# Patient Record
Sex: Female | Born: 1995 | Race: Asian | Hispanic: No | Smoking: Never smoker
Health system: Southern US, Community
[De-identification: ages and names within clinical notes are randomized; demographics above are authoritative.]

## PROBLEM LIST (undated history)

## (undated) DIAGNOSIS — Z862 Personal history of diseases of the blood and blood-forming organs and certain disorders involving the immune mechanism: Secondary | ICD-10-CM

## (undated) DIAGNOSIS — Z8751 Personal history of pre-term labor: Secondary | ICD-10-CM

## (undated) DIAGNOSIS — D649 Anemia, unspecified: Secondary | ICD-10-CM

## (undated) DIAGNOSIS — Z8679 Personal history of other diseases of the circulatory system: Secondary | ICD-10-CM

## (undated) HISTORY — DX: Anemia, unspecified: D64.9

## (undated) HISTORY — DX: Personal history of other diseases of the circulatory system: Z86.79

## (undated) HISTORY — DX: Personal history of diseases of the blood and blood-forming organs and certain disorders involving the immune mechanism: Z86.2

## (undated) HISTORY — PX: OTHER SURGICAL HISTORY: SHX169

## (undated) HISTORY — DX: Personal history of pre-term labor: Z87.51

---

## 2017-03-03 DIAGNOSIS — O4290 Premature rupture of membranes, unspecified as to length of time between rupture and onset of labor, unspecified weeks of gestation: Secondary | ICD-10-CM

## 2018-06-12 DIAGNOSIS — N883 Incompetence of cervix uteri: Secondary | ICD-10-CM

## 2020-06-14 ENCOUNTER — Other Ambulatory Visit: Payer: Self-pay

## 2020-06-14 ENCOUNTER — Encounter: Payer: Self-pay | Admitting: Advanced Practice Midwife

## 2020-06-14 ENCOUNTER — Ambulatory Visit: Payer: Medicaid Other | Admitting: Advanced Practice Midwife

## 2020-06-14 VITALS — BP 96/57 | HR 95 | Temp 97.6°F | Wt 94.0 lb

## 2020-06-14 DIAGNOSIS — O0993 Supervision of high risk pregnancy, unspecified, third trimester: Secondary | ICD-10-CM | POA: Diagnosis not present

## 2020-06-14 DIAGNOSIS — Z8751 Personal history of pre-term labor: Secondary | ICD-10-CM

## 2020-06-14 DIAGNOSIS — O99519 Diseases of the respiratory system complicating pregnancy, unspecified trimester: Secondary | ICD-10-CM

## 2020-06-14 DIAGNOSIS — O99019 Anemia complicating pregnancy, unspecified trimester: Secondary | ICD-10-CM | POA: Insufficient documentation

## 2020-06-14 DIAGNOSIS — O99012 Anemia complicating pregnancy, second trimester: Secondary | ICD-10-CM

## 2020-06-14 DIAGNOSIS — O0930 Supervision of pregnancy with insufficient antenatal care, unspecified trimester: Secondary | ICD-10-CM

## 2020-06-14 DIAGNOSIS — O0991 Supervision of high risk pregnancy, unspecified, first trimester: Secondary | ICD-10-CM

## 2020-06-14 DIAGNOSIS — R6252 Short stature (child): Secondary | ICD-10-CM

## 2020-06-14 DIAGNOSIS — J45909 Unspecified asthma, uncomplicated: Secondary | ICD-10-CM | POA: Insufficient documentation

## 2020-06-14 DIAGNOSIS — Z23 Encounter for immunization: Secondary | ICD-10-CM | POA: Diagnosis not present

## 2020-06-14 LAB — URINALYSIS
Bilirubin, UA: NEGATIVE
Glucose, UA: NEGATIVE
Ketones, UA: NEGATIVE
Leukocytes,UA: NEGATIVE
Nitrite, UA: NEGATIVE
Protein,UA: NEGATIVE
RBC, UA: NEGATIVE
Specific Gravity, UA: 1.025 (ref 1.005–1.030)
Urobilinogen, Ur: 0.2 mg/dL (ref 0.2–1.0)
pH, UA: 7 (ref 5.0–7.5)

## 2020-06-14 LAB — WET PREP FOR TRICH, YEAST, CLUE
Trichomonas Exam: NEGATIVE
Yeast Exam: NEGATIVE

## 2020-06-14 LAB — HEMOGLOBIN, FINGERSTICK: Hemoglobin: 10.1 g/dL — ABNORMAL LOW (ref 11.1–15.9)

## 2020-06-14 MED ORDER — FERROUS SULFATE 324 (65 FE) MG PO TBEC: 1.0000 | DELAYED_RELEASE_TABLET | Freq: Every day | ORAL | 0 refills | Status: DC

## 2020-06-14 NOTE — Progress Notes (Signed)
[facility] HEALTH DEPT [facility] [address] [place] [address] [phone]  INITIAL PRENATAL VISIT NOTE  Subjective:  Angela Holloway is a 24 y.o. SAF [id number] (3,2) nonsmoker feels "good" about surprise pregnancy with no birth control.  LMP 02/09/20.  Denies cigs, vaping, MJ, or ETOH ever.  Not working.  24 yo employed FOB feels "good" about pregnancy and has no children; in supportive 1 year relationship.  Pt living with FOB and her 2 children.  Has been in [place] x 10 years after coming from [place].  At [redacted]w[redacted]d being seen today to start prenatal care at the [facility] Department.  She is currently monitored for the following issues for this high-risk pregnancy and has Supervision of high risk pregnancy in first trimester; History of premature delivery x2 at 28 wks 02/2017 & 05/2018; Short stature; and Late prenatal care complicating pregnancy, unspecified trimester on their problem list.  Patient reports no complaints.   .  .  Movement: Absent. Denies leaking of fluid.   Indications for ASA therapy (per uptodate) One of the following: Previous pregnancy with preeclampsia, especially early onset and with an adverse outcome No Multifetal gestation No Chronic hypertension No Type 1 or 2 diabetes mellitus No Chronic kidney disease No Autoimmune disease (antiphospholipid syndrome, systemic lupus erythematosus) No  Two or more of the following: Nulliparity No Obesity (body mass index >30 kg/m2) No Family history of preeclampsia in mother or sister No Age ?35 years No Sociodemographic characteristics (African American race, low socioeconomic level) No Personal risk factors (eg, previous pregnancy with low birth weight or small for gestational age infant, previous adverse pregnancy outcome [eg, stillbirth], interval >10 years between pregnancies) Yes   The following portions of the patient's history were reviewed and updated as  appropriate: allergies, current medications, past family history, past medical history, past social history, past surgical history and problem list. Problem list updated.  Objective:   Vitals:   06/14/20 1341  BP: (!) 96/57  Pulse: 95  Temp: 97.6 F (36.4 C)  Weight: 94 lb (42.6 kg)    Fetal Status:   Fundal Height: 19 cm Movement: Absent      Physical Exam Vitals and nursing note reviewed.  Constitutional:      General: She is not in acute distress.    Appearance: Normal appearance. She is well-developed and normal weight.  HENT:     Head: Normocephalic and atraumatic.     Right Ear: External ear normal.     Left Ear: External ear normal.     Nose: Nose normal. No congestion or rhinorrhea.     Mouth/Throat:     Lips: Pink.     Mouth: Mucous membranes are moist.     Dentition: Normal dentition. No dental caries.     Pharynx: Oropharynx is clear. Uvula midline.  Eyes:     General: No scleral icterus.    Conjunctiva/sclera: Conjunctivae normal.  Neck:     Thyroid: No thyroid mass or thyromegaly.  Cardiovascular:     Rate and Rhythm: Normal rate.     Pulses: Normal pulses.     Comments: Extremities are warm and well perfused Pulmonary:     Effort: Pulmonary effort is normal.     Breath sounds: Normal breath sounds.  Chest:     Breasts: Breasts are symmetrical.        Right: Normal. No mass, nipple discharge or skin change.  Left: Normal. No mass, nipple discharge or skin change.  Abdominal:     Palpations: Abdomen is soft.     Tenderness: There is no abdominal tenderness.     Comments: Gravid, soft 19 wks  Genitourinary:    General: Normal vulva.     Exam position: Lithotomy position.     Pubic Area: No rash.      Labia:        Right: No rash.        Left: No rash.      Vagina: Vaginal discharge (white, ph<4.5) present.     Cervix: No cervical motion tenderness or friability (friable to pap).     Uterus: Enlarged (Gravid 19 wks size). Not tender.       Rectum: Normal. No external hemorrhoid.  Musculoskeletal:     Right lower leg: No edema.     Left lower leg: No edema.  Lymphadenopathy:     Upper Body:     Right upper body: No axillary adenopathy.     Left upper body: No axillary adenopathy.  Skin:    General: Skin is warm.     Capillary Refill: Capillary refill takes less than 2 seconds.  Neurological:     Mental Status: She is alert.     Assessment and Plan:  Pregnancy: [id number] at [redacted]w[redacted]d  1. Supervision of high risk pregnancy in first trimester [facility] Perinatal high risk consult done for hx PTD x2 and possible circlage? Pt declines 17-P Pt desires Quad screen (too late in care for FIRST screen)  - Prenatal profile without Varicella or Rubella - Chlamydia/GC NAA, Confirmation - HCV Ab w Reflex to Quant PCR - Hgb Fractionation Cascade - HIV Antibody (routine testing w rflx) - Lead, blood (adult age 82 yrs or greater) - Urine Culture - [id number] Drug Screen - Pap IG (Image Guided) - WET PREP FOR TRICH, YEAST, CLUE - Urinalysis (Urine Dip) - Hemoglobin, venipuncture  2. History of premature delivery x2 at 9 wks 02/2017 & 05/2018 [facility] Perinatal high risk consult for possible circlage? Dating u/s ROI for both delivery notes  3. Short stature   4. Late prenatal care complicating pregnancy, unspecified trimester 5. Asthma--last attack 6 years ago Peak flows today (no meters in health dept and on order)     Discussed overview of care and coordination with inpatient delivery practices including [facility], [facility], [facility] and [facility].   Reviewed Centering pregnancy as standard of care at [facility], oriented to room and showed video. Based on EDD, plan for Cycle    Preterm labor symptoms and general obstetric precautions including but not limited to vaginal bleeding, contractions, leaking of fluid and fetal movement were reviewed in detail with the patient.  Please refer to After Visit Summary for other counseling  recommendations.   No follow-ups on file.  No future appointments.  Alberteen Spindle, CNM

## 2020-06-14 NOTE — Progress Notes (Addendum)
In house labs reviewed, patient treated for anemia per SO. Anemia pamphlet given and anemia profile added to orders. MFM at [facility] referral faxed with confirmation. ROI faxed to [facility] for delivery notes for previous 2 pregnancies.Burt Knack, RN

## 2020-06-14 NOTE — Progress Notes (Signed)
Patient here for new OB visit with 24 year old son, at about 37 2/7. Patient unsure if she's ever had a Pap test. Patient had PT at [facility] Department and has had no other OB visits or hospital visits during this pregnancy. FOB is in waiting area Christiane Ha), and he would like to be present when possible for heartbeat.Burt Knack, RN

## 2020-06-15 ENCOUNTER — Other Ambulatory Visit: Payer: Self-pay | Admitting: Advanced Practice Midwife

## 2020-06-15 ENCOUNTER — Telehealth: Payer: Self-pay

## 2020-06-15 DIAGNOSIS — Z3689 Encounter for other specified antenatal screening: Secondary | ICD-10-CM

## 2020-06-15 LAB — 789231 7+OXYCODONE-BUND
Amphetamines, Urine: NEGATIVE ng/mL
BENZODIAZ UR QL: NEGATIVE ng/mL
Barbiturate screen, urine: NEGATIVE ng/mL
Cannabinoid Quant, Ur: NEGATIVE ng/mL
Cocaine (Metab.): NEGATIVE ng/mL
OPIATE SCREEN URINE: NEGATIVE ng/mL
Oxycodone/Oxymorphone, Urine: NEGATIVE ng/mL
PCP Quant, Ur: NEGATIVE ng/mL

## 2020-06-15 LAB — FE+CBC/D/PLT+TIBC+FER+RETIC
Basophils Absolute: 0 10*3/uL (ref 0.0–0.2)
Basos: 1 %
EOS (ABSOLUTE): 0.1 10*3/uL (ref 0.0–0.4)
Eos: 1 %
Ferritin: 49 ng/mL (ref 15–150)
Hematocrit: 33.7 % — ABNORMAL LOW (ref 34.0–46.6)
Hemoglobin: 10.1 g/dL — ABNORMAL LOW (ref 11.1–15.9)
Immature Grans (Abs): 0 10*3/uL (ref 0.0–0.1)
Immature Granulocytes: 1 %
Iron Saturation: 46 % (ref 15–55)
Iron: 135 ug/dL (ref 27–159)
Lymphocytes Absolute: 2 10*3/uL (ref 0.7–3.1)
Lymphs: 23 %
MCH: 21.4 pg — ABNORMAL LOW (ref 26.6–33.0)
MCHC: 30 g/dL — ABNORMAL LOW (ref 31.5–35.7)
MCV: 71 fL — ABNORMAL LOW (ref 79–97)
Monocytes Absolute: 0.6 10*3/uL (ref 0.1–0.9)
Monocytes: 7 %
Neutrophils Absolute: 5.8 10*3/uL (ref 1.4–7.0)
Neutrophils: 67 %
Platelets: 295 10*3/uL (ref 150–450)
RBC: 4.73 x10E6/uL (ref 3.77–5.28)
RDW: 15.1 % (ref 11.7–15.4)
Retic Ct Pct: 1.6 % (ref 0.6–2.6)
Total Iron Binding Capacity: 296 ug/dL (ref 250–450)
UIBC: 161 ug/dL (ref 131–425)
WBC: 8.6 10*3/uL (ref 3.4–10.8)

## 2020-06-15 LAB — CBC/D/PLT+RPR+RH+ABO+AB SCR
Antibody Screen: NEGATIVE
Hepatitis B Surface Ag: NEGATIVE
RPR Ser Ql: NONREACTIVE
Rh Factor: POSITIVE

## 2020-06-15 LAB — PAP IG (IMAGE GUIDED): PAP Smear Comment: 0

## 2020-06-15 LAB — HCV AB W REFLEX TO QUANT PCR: HCV Ab: <0.1 s/co ratio (ref 0.0–0.9)

## 2020-06-15 LAB — HCV INTERPRETATION

## 2020-06-15 LAB — HIV ANTIBODY (ROUTINE TESTING W REFLEX): HIV Screen 4th Generation wRfx: NONREACTIVE

## 2020-06-15 NOTE — Telephone Encounter (Signed)
TC to patient to inform of [facility] MFM U/S and consult appointment on 06/23/20 at 2:00pm. Patient counseled to arrive at 1:45 at the [facility] and to let them know she is there for an appointment at [facility] MFM. Patient given address to [facility] and counseled to not take children to the appointment with her.Burt Knack, RN

## 2020-06-16 LAB — HGB FRACTIONATION CASCADE
Hgb A2: 2.3 % (ref 1.8–3.2)
Hgb A: 97.7 % (ref 96.4–98.8)
Hgb F: 0 % (ref 0.0–2.0)
Hgb S: 0 %

## 2020-06-16 LAB — URINE CULTURE

## 2020-06-16 LAB — LEAD, BLOOD (ADULT >= 16 YRS): Lead-Whole Blood: 1 ug/dL (ref 0–4)

## 2020-06-17 ENCOUNTER — Telehealth: Payer: Self-pay

## 2020-06-17 LAB — CHLAMYDIA/GC NAA, CONFIRMATION
Chlamydia trachomatis, NAA: NEGATIVE
Neisseria gonorrhoeae, NAA: NEGATIVE

## 2020-06-17 NOTE — Telephone Encounter (Signed)
TC to [facility] to add B12 and folate to orders from 06/14/2020, per E. Sciora orders. Test authorization form signed and faxed to [facility]..Marland KitchenMarland KitchenBurt Knack, RN

## 2020-06-18 LAB — B12 AND FOLATE PANEL
Folate: >20 ng/mL (ref >3.0)
Vitamin B-12: 554 pg/mL (ref 232–1245)

## 2020-06-18 LAB — SPECIMEN STATUS REPORT

## 2020-06-23 ENCOUNTER — Ambulatory Visit: Payer: Medicaid Other | Attending: Maternal & Fetal Medicine

## 2020-06-23 ENCOUNTER — Ambulatory Visit (HOSPITAL_BASED_OUTPATIENT_CLINIC_OR_DEPARTMENT_OTHER): Payer: Medicaid Other | Admitting: Maternal & Fetal Medicine

## 2020-06-23 ENCOUNTER — Other Ambulatory Visit: Payer: Self-pay

## 2020-06-23 DIAGNOSIS — Z8759 Personal history of other complications of pregnancy, childbirth and the puerperium: Secondary | ICD-10-CM | POA: Diagnosis not present

## 2020-06-23 DIAGNOSIS — Z3689 Encounter for other specified antenatal screening: Secondary | ICD-10-CM

## 2020-06-23 DIAGNOSIS — Z3A18 18 weeks gestation of pregnancy: Secondary | ICD-10-CM

## 2020-06-23 DIAGNOSIS — Z3A19 19 weeks gestation of pregnancy: Secondary | ICD-10-CM | POA: Diagnosis not present

## 2020-06-23 DIAGNOSIS — O09212 Supervision of pregnancy with history of pre-term labor, second trimester: Secondary | ICD-10-CM | POA: Diagnosis not present

## 2020-06-23 DIAGNOSIS — O99012 Anemia complicating pregnancy, second trimester: Secondary | ICD-10-CM

## 2020-06-23 DIAGNOSIS — Z8751 Personal history of pre-term labor: Secondary | ICD-10-CM

## 2020-06-23 MED ORDER — PROGESTERONE 100 MG VA INST: 100.0000 mg | VAGINAL_INSERT | Freq: Two times a day (BID) | VAGINAL | 12 refills | Status: AC

## 2020-06-23 NOTE — Progress Notes (Signed)
MFM Consultation  Date of Service: 06/23/20 Reason for request: Prior history of twp preterm birth  Requesting provider: Arnetha Courser, CNM  Ms. Brickel is a 24 yo G3 P2 at 23 w 2 d who is here in consultation regarding a history of two preterm deliveries at 48 and [redacted] weeks gestation.  She is overall doing well. She entered prenatal care at 18 weeks. She is dated by and LMP. Today's ultrasound is consistent with these dates.  Her prenatal care has been uneventful. She was counseled and offered 17OH P but declined according to Epic notes. She had a quad screen drawn and is pending.  Ms. Ungerer recalls that she delivered in [place] the last two pregnancies. She recalls that her prior to pregnancies began with a shortened cervix. She ultimately began to dilate and subsequently began to labor and ultimately delivered at 28 and [redacted] weeks gestation.  Vitals with BMI 06/23/2020 06/14/2020 06/13/2020  Height - - 4\' 11"   Weight 96 lbs 8 oz 94 lbs -  BMI 19.48 18.98 -  Systolic 109 96 -  Diastolic 71 57 -  Pulse 101 95 -   OB History  Gravida Para Term Preterm AB Living  3 2 0 2 0 2  SAB TAB Ectopic Multiple Live Births          2    # Outcome Date GA Lbr Len/2nd Weight Sex Delivery Anes PTL Lv  3 Current           2 Preterm 06/12/18 [redacted]w[redacted]d  1814 g F Vag-Spont None Y LIV     Complications: Short cervix  1 Preterm 03/03/17 [redacted]w[redacted]d  2268 g M Vag-Spont EPI Y LIV     Complications: PROM (premature rupture of membranes)   Past Medical History:  Diagnosis Date  . Anemia    both pregnancies   Past Surgical History:  Procedure Laterality Date  . denies surgical history     Family History  Problem Relation Age of Onset  . Diabetes Mother   . Heart murmur Brother   . Asthma Brother   . Depression Brother    Social History   Socioeconomic History  . Marital status: Single    Spouse name: Not on file  . Number of children: 2  . Years of education: 70  . Highest education level: Not on  file  Occupational History  . Occupation: none  Tobacco Use  . Smoking status: Never Smoker  . Smokeless tobacco: Never Used  . Tobacco comment: denies second hand smoke  Vaping Use  . Vaping Use: Never used  Substance and Sexual Activity  . Alcohol use: Not Currently  . Drug use: Not Currently  . Sexual activity: Yes  Other Topics Concern  . Not on file  Social History Narrative  . Not on file   Social Determinants of Health   Financial Resource Strain: High Risk  . Difficulty of Paying Living Expenses: Hard  Food Insecurity: Food Insecurity Present  . Worried About 4 in the Last Year: Sometimes true  . Ran Out of Food in the Last Year: Sometimes true  Transportation Needs: No Transportation Needs  . Lack of Transportation (Medical): No  . Lack of Transportation (Non-Medical): No  Physical Activity:   . Days of Exercise per Week: Not on file  . Minutes of Exercise per Session: Not on file  Stress:   . Feeling of Stress : Not on file  Social Connections:   . Frequency  of Communication with Friends and Family: Not on file  . Frequency of Social Gatherings with Friends and Family: Not on file  . Attends Religious Services: Not on file  . Active Member of Clubs or Organizations: Not on file  . Attends Banker Meetings: Not on file  . Marital Status: Not on file  Intimate Partner Violence: Not At Risk  . Fear of Current or Ex-Partner: No  . Emotionally Abused: No  . Physically Abused: No  . Sexually Abused: No   Impression/Counseling:  I reviewed today's examination and discussed the normal nature of the anatomy and confirmed dates. Her cervix appeared long and closed. Ms. Revard is a petite woman and thus suspects why the EFW is at the 13 % with a normal AC with symmetric biometry.  We reviewed her history of preterm birth and discussed the evaluation and management of someone with a prior preterm birth. We discussed the increased risk  for a subsequent preterm birth in this pregnancy and the associated morbidity and mortality for preterm delivery. We reviewed PTB prevention strategies including a history indicated cerclage, cervical length surveillance, and progesterone therapy either by IM injection or nightly vaginal progesterone.   I reviewed the benefits and risk for the above approaches for PTB prevention. I particular recommended a history indicated cerclage.However, after discussion with her significant other they opted for serial cervical length surveillance and nightly vaginal progesterone until 36 weeks. A prescription was sent today.  Recommendation: 1) Cervical length measurement in 2 weeks, If cervical length is < 2 cm consider an ultrasound indicated cerclage. 2) Began nightly vaginal progesterone placement until 36 weeks 3) Repeat growth in 4-6 weeks.   I spent 45 minute with > 50% in face to face consultation and care coordination.  Novella Olive, MD.

## 2020-06-28 ENCOUNTER — Encounter: Payer: Self-pay | Admitting: Advanced Practice Midwife

## 2020-07-04 ENCOUNTER — Other Ambulatory Visit: Payer: Self-pay | Admitting: Advanced Practice Midwife

## 2020-07-04 DIAGNOSIS — O09292 Supervision of pregnancy with other poor reproductive or obstetric history, second trimester: Secondary | ICD-10-CM

## 2020-07-04 DIAGNOSIS — O09299 Supervision of pregnancy with other poor reproductive or obstetric history, unspecified trimester: Secondary | ICD-10-CM

## 2020-07-04 DIAGNOSIS — Z8751 Personal history of pre-term labor: Secondary | ICD-10-CM

## 2020-07-07 ENCOUNTER — Other Ambulatory Visit: Payer: Self-pay

## 2020-07-07 ENCOUNTER — Ambulatory Visit: Payer: Medicaid Other | Attending: Obstetrics

## 2020-07-07 DIAGNOSIS — Z8751 Personal history of pre-term labor: Secondary | ICD-10-CM

## 2020-07-07 DIAGNOSIS — O09212 Supervision of pregnancy with history of pre-term labor, second trimester: Secondary | ICD-10-CM | POA: Insufficient documentation

## 2020-07-07 DIAGNOSIS — Z3A21 21 weeks gestation of pregnancy: Secondary | ICD-10-CM | POA: Diagnosis not present

## 2020-07-07 DIAGNOSIS — O09299 Supervision of pregnancy with other poor reproductive or obstetric history, unspecified trimester: Secondary | ICD-10-CM

## 2020-07-07 DIAGNOSIS — O3432 Maternal care for cervical incompetence, second trimester: Secondary | ICD-10-CM

## 2020-07-07 DIAGNOSIS — O09292 Supervision of pregnancy with other poor reproductive or obstetric history, second trimester: Secondary | ICD-10-CM | POA: Diagnosis not present

## 2020-07-12 ENCOUNTER — Ambulatory Visit: Payer: Medicaid Other | Admitting: Advanced Practice Midwife

## 2020-07-12 ENCOUNTER — Other Ambulatory Visit: Payer: Self-pay

## 2020-07-12 VITALS — BP 93/55 | HR 100 | Temp 97.4°F | Wt 98.2 lb

## 2020-07-12 DIAGNOSIS — O99012 Anemia complicating pregnancy, second trimester: Secondary | ICD-10-CM

## 2020-07-12 DIAGNOSIS — J45909 Unspecified asthma, uncomplicated: Secondary | ICD-10-CM

## 2020-07-12 DIAGNOSIS — O99519 Diseases of the respiratory system complicating pregnancy, unspecified trimester: Secondary | ICD-10-CM

## 2020-07-12 DIAGNOSIS — O0991 Supervision of high risk pregnancy, unspecified, first trimester: Secondary | ICD-10-CM

## 2020-07-12 DIAGNOSIS — Z8751 Personal history of pre-term labor: Secondary | ICD-10-CM

## 2020-07-12 LAB — HEMOGLOBIN, FINGERSTICK: Hemoglobin: 9.5 g/dL — ABNORMAL LOW (ref 11.1–15.9)

## 2020-07-12 NOTE — Progress Notes (Signed)
Hgb reviewed, patient counseled to take iron 3x/day with orange juice. Patient states she is taking her iron with orange juice. Patient states understanding to take iron tablets at three different times of day and at a different time than her PNV. Recheck Hgb in 1 month.Burt Knack, RN

## 2020-07-12 NOTE — Progress Notes (Addendum)
Aware of 07/18/20 and 08/04/2020 [place] appts at [facility] MFM. Kept 07/05/2020 MFM appt.  Correctly verbalizes how to take iron and PNV. Taking iron with juice. Jossie Ng, RN  Call by York Ram RN to [facility] Health Information to ascertain status of ROI for pre-term delivery (x2) records that was faxed with confirmation received on 06/14/2020 as records not yet received. Per Ms. Barbette Merino, left message for a return call with number to call provided. Jossie Ng, RN

## 2020-07-12 NOTE — Progress Notes (Signed)
   PRENATAL VISIT NOTE  Subjective:  Angela Holloway is a 24 y.o. [id number] at [redacted]w[redacted]d being seen today for ongoing prenatal care.  She is currently monitored for the following issues for this high-risk pregnancy and has Supervision of high risk pregnancy in first trimester; History of premature delivery x2 at 28 wks 02/2017 & 05/2018; Short stature; Late prenatal care complicating pregnancy, unspecified trimester; Anemia affecting pregnancy; and Asthma during pregnancy with last attack 6 years ago on their problem list.  Patient reports no complaints.  Contractions: Not present. Vag. Bleeding: None.  Movement: Absent. Denies leaking of fluid/ROM.   The following portions of the patient's history were reviewed and updated as appropriate: allergies, current medications, past family history, past medical history, past social history, past surgical history and problem list. Problem list updated.  Objective:   Vitals:   07/12/20 1530  BP: (!) 93/55  Pulse: 100  Temp: (!) 97.4 F (36.3 C)  Weight: 98 lb 3.2 oz (44.5 kg)    Fetal Status: Fetal Heart Rate (bpm): 160 Fundal Height: 22 cm Movement: Absent     General:  Alert, oriented and cooperative. Patient is in no acute distress.  Skin: Skin is warm and dry. No rash noted.   Cardiovascular: Normal heart rate noted  Respiratory: Normal respiratory effort, no problems with respiration noted  Abdomen: Soft, gravid, appropriate for gestational age.  Pain/Pressure: Absent     Pelvic: Cervical exam deferred        Extremities: Normal range of motion.  Edema: None  Mental Status: Normal mood and affect. Normal behavior. Normal judgment and thought content.   Assessment and Plan:  Pregnancy: [id number] at [redacted]w[redacted]d  1. Anemia affecting pregnancy in second trimester Taking I FeSo4 daily - Hemoglobin, venipuncture  2. Supervision of high risk pregnancy in first trimester Not working.  Feels well.  Here with young daughter and FOB  3. History of premature  delivery x2 at 28 wks 02/2017 & 05/2018 Have not received delivery records for those births yet--RN to call Pt chose to take progesterone qHs vaginally and is doing so, and serial cx length u/s q 2 wks; 06/23/20 @ 19 2/7 with cx length=3.48 cm, AFI wnl, anatomy wnl, EFW 13%.  U/s 07/07/20 @ 21 2/7 with cx length=3.7 cm, AFI wnl, posterior placenta. Next u/s 07/18/20.  Needs f/u growth u/s q 4-6 wks (08/04/20). MFM consult and u/s 07/04/20.  4. Asthma during pregnancy with last attack 6 years ago Denies sxs asthma and no need for Albuteral   Preterm labor symptoms and general obstetric precautions including but not limited to vaginal bleeding, contractions, leaking of fluid and fetal movement were reviewed in detail with the patient. Please refer to After Visit Summary for other counseling recommendations.  No follow-ups on file.  Future Appointments  Date Time Provider Department Center  07/18/2020 10:00 AM [facility]  08/04/2020  1:00 PM [facility]    Alberteen Spindle, CNM

## 2020-07-14 ENCOUNTER — Other Ambulatory Visit: Payer: Self-pay | Admitting: Advanced Practice Midwife

## 2020-07-14 DIAGNOSIS — O09299 Supervision of pregnancy with other poor reproductive or obstetric history, unspecified trimester: Secondary | ICD-10-CM

## 2020-07-14 DIAGNOSIS — Z8759 Personal history of other complications of pregnancy, childbirth and the puerperium: Secondary | ICD-10-CM

## 2020-07-14 DIAGNOSIS — O09293 Supervision of pregnancy with other poor reproductive or obstetric history, third trimester: Secondary | ICD-10-CM

## 2020-07-14 DIAGNOSIS — O09893 Supervision of other high risk pregnancies, third trimester: Secondary | ICD-10-CM

## 2020-07-14 NOTE — Addendum Note (Signed)
Addended by: Heywood Bene on: 07/14/2020 02:39 PM   Modules accepted: Orders

## 2020-07-18 ENCOUNTER — Ambulatory Visit: Payer: Medicaid Other

## 2020-07-21 ENCOUNTER — Other Ambulatory Visit: Payer: Self-pay

## 2020-07-21 ENCOUNTER — Ambulatory Visit: Payer: Medicaid Other | Attending: Maternal & Fetal Medicine

## 2020-07-21 DIAGNOSIS — Z8759 Personal history of other complications of pregnancy, childbirth and the puerperium: Secondary | ICD-10-CM

## 2020-07-21 DIAGNOSIS — Z3A23 23 weeks gestation of pregnancy: Secondary | ICD-10-CM | POA: Insufficient documentation

## 2020-07-21 DIAGNOSIS — O09212 Supervision of pregnancy with history of pre-term labor, second trimester: Secondary | ICD-10-CM | POA: Diagnosis not present

## 2020-07-21 DIAGNOSIS — O3432 Maternal care for cervical incompetence, second trimester: Secondary | ICD-10-CM

## 2020-07-21 DIAGNOSIS — O99012 Anemia complicating pregnancy, second trimester: Secondary | ICD-10-CM

## 2020-07-21 DIAGNOSIS — O09293 Supervision of pregnancy with other poor reproductive or obstetric history, third trimester: Secondary | ICD-10-CM

## 2020-07-21 DIAGNOSIS — O09299 Supervision of pregnancy with other poor reproductive or obstetric history, unspecified trimester: Secondary | ICD-10-CM

## 2020-07-21 DIAGNOSIS — O09292 Supervision of pregnancy with other poor reproductive or obstetric history, second trimester: Secondary | ICD-10-CM

## 2020-07-21 DIAGNOSIS — O09893 Supervision of other high risk pregnancies, third trimester: Secondary | ICD-10-CM

## 2020-07-22 ENCOUNTER — Telehealth: Payer: Self-pay

## 2020-07-22 NOTE — Telephone Encounter (Signed)
ROI for delivery summaries from 2018 and 2019 faxed 8.25.2021 with confirmation received. On 07/13/2020 received faxed records, but not those requested. Clinic received client's signed safety contract, After Visit Summary and Patient's Discharge Instructions. Call to [facility] in attempt to be transferred to a live person. Call transferred to Mr. Willa Rough and left message on voicemail regarding above and records actually needed. Left number to call on voicemail. Jossie Ng, RN

## 2020-07-26 ENCOUNTER — Encounter: Payer: Self-pay | Admitting: Advanced Practice Midwife

## 2020-08-01 ENCOUNTER — Other Ambulatory Visit: Payer: Self-pay | Admitting: Maternal & Fetal Medicine

## 2020-08-01 DIAGNOSIS — Z8751 Personal history of pre-term labor: Secondary | ICD-10-CM

## 2020-08-01 DIAGNOSIS — O09292 Supervision of pregnancy with other poor reproductive or obstetric history, second trimester: Secondary | ICD-10-CM

## 2020-08-04 ENCOUNTER — Other Ambulatory Visit: Payer: Self-pay

## 2020-08-04 ENCOUNTER — Ambulatory Visit: Payer: Medicaid Other

## 2020-08-09 ENCOUNTER — Other Ambulatory Visit: Payer: Self-pay

## 2020-08-09 ENCOUNTER — Ambulatory Visit: Payer: Medicaid Other | Admitting: Advanced Practice Midwife

## 2020-08-09 VITALS — BP 89/59 | HR 97 | Temp 96.5°F | Wt 102.2 lb

## 2020-08-09 DIAGNOSIS — O0991 Supervision of high risk pregnancy, unspecified, first trimester: Secondary | ICD-10-CM

## 2020-08-09 DIAGNOSIS — O99012 Anemia complicating pregnancy, second trimester: Secondary | ICD-10-CM

## 2020-08-09 LAB — HEMOGLOBIN, FINGERSTICK: Hemoglobin: 10 g/dL — ABNORMAL LOW (ref 11.1–15.9)

## 2020-08-09 NOTE — Progress Notes (Signed)
Reports has been taking iron BID and PNV QD. Correctly verbalizes how to take above medicines and taking iron with apple juice. Hgb today. Hgb = 10.0 and to continue BID iron. Jossie Ng, RN [facility] MFM referral for serial growth US's faxed with fax confirmation received. Jossie Ng, RN

## 2020-08-09 NOTE — Progress Notes (Signed)
Hgb is 10.0, reviewed with provider. Pt counseled to continue to take iron BID with vitamin C drink. Pt verbalizes understanding. Sharlyne Pacas, RN

## 2020-08-09 NOTE — Progress Notes (Signed)
   PRENATAL VISIT NOTE  Subjective:  Angela Holloway is a 24 y.o. W7P7106 at [redacted]w[redacted]d being seen today for ongoing prenatal care.  She is currently monitored for the following issues for this high-risk pregnancy and has Supervision of high risk pregnancy in first trimester; History of premature delivery x2 at 28 wks 02/2017 & 05/2018; Short stature; Late prenatal care complicating pregnancy, unspecified trimester; Anemia affecting pregnancy; and Asthma during pregnancy with last attack 6 years ago on their problem list.  Patient reports no complaints.  Contractions: Not present. Vag. Bleeding: None.  Movement: Present. Denies leaking of fluid/ROM.   The following portions of the patient's history were reviewed and updated as appropriate: allergies, current medications, past family history, past medical history, past social history, past surgical history and problem list. Problem list updated.  Objective:   Vitals:   08/09/20 1511  BP: (!) 89/59  Pulse: 97  Temp: (!) 96.5 F (35.8 C)  Weight: 102 lb 3.2 oz (46.4 kg)    Fetal Status: Fetal Heart Rate (bpm): 150 Fundal Height: 25 cm Movement: Present     General:  Alert, oriented and cooperative. Patient is in no acute distress.  Skin: Skin is warm and dry. No rash noted.   Cardiovascular: Normal heart rate noted  Respiratory: Normal respiratory effort, no problems with respiration noted  Abdomen: Soft, gravid, appropriate for gestational age.  Pain/Pressure: Absent     Pelvic: Cervical exam deferred        Extremities: Normal range of motion.  Edema: None  Mental Status: Normal mood and affect. Normal behavior. Normal judgment and thought content.   Assessment and Plan:  Pregnancy: [id number] at [redacted]w[redacted]d  1. Supervision of high risk pregnancy in first trimester Not working.  Denies sxs asthma or need for Albuterol.  Using nightly vaginal progesterone.  Kept 08/04/20 growth u/s but not in Epic yet.  Needs another growth u/s ~09/15/20  2. Anemia  during pregnancy in second trimester States taking FeSo4 BID with apple juice Hgb today - Hemoglobin, venipuncture   Preterm labor symptoms and general obstetric precautions including but not limited to vaginal bleeding, contractions, leaking of fluid and fetal movement were reviewed in detail with the patient. Please refer to After Visit Summary for other counseling recommendations.  Return in about 2 weeks (around 08/23/2020) for routine PNC.  No future appointments.  Alberteen Spindle, CNM

## 2020-08-10 ENCOUNTER — Telehealth: Payer: Self-pay

## 2020-08-10 NOTE — Telephone Encounter (Signed)
Call to [facility] at [facility] MFM to ascertain if [place] scheduled as per referral faxed 08/09/2020. Left message to call with number to call provided. Jossie Ng, RN

## 2020-08-11 ENCOUNTER — Other Ambulatory Visit: Payer: Self-pay | Admitting: Advanced Practice Midwife

## 2020-08-11 ENCOUNTER — Telehealth: Payer: Self-pay

## 2020-08-11 DIAGNOSIS — O09893 Supervision of other high risk pregnancies, third trimester: Secondary | ICD-10-CM

## 2020-08-11 NOTE — Telephone Encounter (Signed)
Client with [facility] appt 08/15/2020 at 0800. Call to client to verify if aware of appt. Per client, unable to keep am appts due to no childcare. Client aware RN will notify MFM and call her back with different appt if available. Call to [facility] MFM clinic and they have no other available appts on Monday. Stated they will attempt to call other clients to try to change appt times to acomodate client and notify RN this pm.. Jossie Ng, RN

## 2020-08-11 NOTE — Telephone Encounter (Signed)
Call from [facility] MFM and unable to change any client appts on 08/15/2020 pm and no available appts on 08/18/2020. The next available appt is 08/22/2020 at 3 pm. Call to client with above information and per client will be able to keep 08/15/2020 0800 appt. Call to [facility] MFM and [facility] (receptionist) aware client states will keep appt. Jossie Ng, RN

## 2020-08-15 ENCOUNTER — Other Ambulatory Visit: Payer: Medicaid Other

## 2020-08-15 ENCOUNTER — Other Ambulatory Visit: Payer: Self-pay

## 2020-08-15 ENCOUNTER — Ambulatory Visit: Payer: Medicaid Other | Attending: Obstetrics and Gynecology

## 2020-08-15 DIAGNOSIS — O09212 Supervision of pregnancy with history of pre-term labor, second trimester: Secondary | ICD-10-CM | POA: Insufficient documentation

## 2020-08-15 DIAGNOSIS — O09292 Supervision of pregnancy with other poor reproductive or obstetric history, second trimester: Secondary | ICD-10-CM

## 2020-08-15 DIAGNOSIS — O99012 Anemia complicating pregnancy, second trimester: Secondary | ICD-10-CM

## 2020-08-15 DIAGNOSIS — Z3A26 26 weeks gestation of pregnancy: Secondary | ICD-10-CM | POA: Insufficient documentation

## 2020-08-15 DIAGNOSIS — O09893 Supervision of other high risk pregnancies, third trimester: Secondary | ICD-10-CM

## 2020-08-15 NOTE — Patient Instructions (Addendum)
Please excuse this patient from work today because she had a appointment @ [facility] MFM.   [phone]  Roxy Horseman, RN

## 2020-08-16 ENCOUNTER — Encounter: Payer: Self-pay | Admitting: Advanced Practice Midwife

## 2020-08-16 ENCOUNTER — Telehealth: Payer: Self-pay | Admitting: Family Medicine

## 2020-08-16 ENCOUNTER — Telehealth: Payer: Self-pay

## 2020-08-16 NOTE — Telephone Encounter (Signed)
Pt. needs forms filled out concerning her pregnangy for her employer.

## 2020-08-16 NOTE — Telephone Encounter (Signed)
TC to patient to inform her that her paperwork has been filled out and is ready for pickup. Employee accomodations form provider portion completed by Hazle Coca, CNM and patient to complete her part. Copy sent for scanning and patient states she will pick up her copy this afternoon.Burt Knack, RN

## 2020-08-17 ENCOUNTER — Other Ambulatory Visit: Payer: Self-pay | Admitting: Advanced Practice Midwife

## 2020-08-17 DIAGNOSIS — O99012 Anemia complicating pregnancy, second trimester: Secondary | ICD-10-CM

## 2020-08-18 ENCOUNTER — Ambulatory Visit: Payer: Medicaid Other

## 2020-08-23 ENCOUNTER — Other Ambulatory Visit: Payer: Self-pay

## 2020-08-23 ENCOUNTER — Ambulatory Visit: Payer: Medicaid Other | Admitting: Advanced Practice Midwife

## 2020-08-23 VITALS — BP 97/65 | HR 105 | Temp 97.5°F | Wt 103.4 lb

## 2020-08-23 DIAGNOSIS — O99519 Diseases of the respiratory system complicating pregnancy, unspecified trimester: Secondary | ICD-10-CM

## 2020-08-23 DIAGNOSIS — Z8751 Personal history of pre-term labor: Secondary | ICD-10-CM

## 2020-08-23 DIAGNOSIS — O99013 Anemia complicating pregnancy, third trimester: Secondary | ICD-10-CM

## 2020-08-23 DIAGNOSIS — O0991 Supervision of high risk pregnancy, unspecified, first trimester: Secondary | ICD-10-CM

## 2020-08-23 DIAGNOSIS — J45909 Unspecified asthma, uncomplicated: Secondary | ICD-10-CM

## 2020-08-23 DIAGNOSIS — Z23 Encounter for immunization: Secondary | ICD-10-CM | POA: Diagnosis not present

## 2020-08-23 DIAGNOSIS — O0933 Supervision of pregnancy with insufficient antenatal care, third trimester: Secondary | ICD-10-CM

## 2020-08-23 DIAGNOSIS — O99012 Anemia complicating pregnancy, second trimester: Secondary | ICD-10-CM

## 2020-08-23 DIAGNOSIS — O0993 Supervision of high risk pregnancy, unspecified, third trimester: Secondary | ICD-10-CM

## 2020-08-23 DIAGNOSIS — O0930 Supervision of pregnancy with insufficient antenatal care, unspecified trimester: Secondary | ICD-10-CM

## 2020-08-23 LAB — HEMOGLOBIN, FINGERSTICK: Hemoglobin: 9.8 g/dL — ABNORMAL LOW (ref 11.1–15.9)

## 2020-08-23 NOTE — Progress Notes (Signed)
   PRENATAL VISIT NOTE  Subjective:  Angela Holloway is a 24 y.o. [id number] at [redacted]w[redacted]d being seen today for ongoing prenatal care.  She is currently monitored for the following issues for this high-risk pregnancy and has Supervision of high risk pregnancy in first trimester; History of premature delivery x2 at 28 wks 02/2017 & 05/2018; Short stature; Late prenatal care complicating pregnancy, unspecified trimester; Anemia affecting pregnancy; and Asthma during pregnancy with last attack 6 years ago on their problem list.  Patient reports no complaints.  Contractions: Not present. Vag. Bleeding: None.  Movement: Present. Denies leaking of fluid/ROM.   The following portions of the patient's history were reviewed and updated as appropriate: allergies, current medications, past family history, past medical history, past social history, past surgical history and problem list. Problem list updated.  Objective:   Vitals:   08/23/20 1521  BP: 97/65  Pulse: (!) 105  Temp: (!) 97.5 F (36.4 C)  Weight: 103 lb 6.4 oz (46.9 kg)    Fetal Status: Fetal Heart Rate (bpm): 160 Fundal Height: 27 cm Movement: Present     General:  Alert, oriented and cooperative. Patient is in no acute distress.  Skin: Skin is warm and dry. No rash noted.   Cardiovascular: Normal heart rate noted  Respiratory: Normal respiratory effort, no problems with respiration noted  Abdomen: Soft, gravid, appropriate for gestational age.  Pain/Pressure: Absent     Pelvic: Cervical exam deferred        Extremities: Normal range of motion.  Edema: None  Mental Status: Normal mood and affect. Normal behavior. Normal judgment and thought content.   Assessment and Plan:  Pregnancy: [id number] at [redacted]w[redacted]d  1. Asthma during pregnancy with last attack 6 years ago Denies sxs  2. Supervision of high risk pregnancy in first trimester Reviewed last u/s 08/15/20 at 26 2/7 with EFW=16%, posterior placenta, growth appropriate but EFW was 41% at last  u/s, AFI wnl, cx length=5.6 cm Working 40 hrs/wk 3 lb wt loss in last 2 wks--no breakfast today (3:50 now) and lunch of rice with chicken, milk 1 hour glucola today Tdap today - HIV Antibody (routine testing w rflx) - RPR - Glucose tolerance, 1 hour  3. Anemia affecting pregnancy in second trimester Taking FeSo4 BID seperately with apple juice  4. History of premature delivery x2 at 28 wks 02/2017 & 05/2018 Using vaginal progesterone nightly  5. Late prenatal care complicating pregnancy, unspecified trimester    Preterm labor symptoms and general obstetric precautions including but not limited to vaginal bleeding, contractions, leaking of fluid and fetal movement were reviewed in detail with the patient. Please refer to After Visit Summary for other counseling recommendations.  No follow-ups on file.  Future Appointments  Date Time Provider Department Center  09/26/2020  1:00 PM [facility]    Alberteen Spindle, CNM

## 2020-08-23 NOTE — Progress Notes (Addendum)
Tdap given, right deltoid, tolerated well, VIS given. Hgb reviewed, patient counseled to take 1 tablet twice daily with juice. Patient counseled to take PNV and each iron tablet separately patient states understanding. Patient given note with her due date included for her employer.Burt Knack, RN

## 2020-08-23 NOTE — Progress Notes (Addendum)
Patient here for MH RV at 28 weeks. 28 week labs today, 1 hour gtt and Tdap. Patient declines flu vaccine today. Patient needs to be to lab for 4:23 blood draw. Patient states she needs provider letter for work stating her current due date. She was given a due date of 12/26/2020 at a previous appointment in [facility].Burt Knack, RN

## 2020-08-24 LAB — RPR: RPR Ser Ql: NONREACTIVE

## 2020-08-24 LAB — GLUCOSE, 1 HOUR GESTATIONAL: Gestational Diabetes Screen: 112 mg/dL (ref 65–139)

## 2020-08-24 LAB — HIV ANTIBODY (ROUTINE TESTING W REFLEX): HIV Screen 4th Generation wRfx: NONREACTIVE

## 2020-08-30 ENCOUNTER — Encounter: Payer: Self-pay | Admitting: Family Medicine

## 2020-08-30 NOTE — Progress Notes (Unsigned)
[facility] Department Maternity Care Conference  Maternity Care Conference Date: 08/30/20  Angela Holloway was identified by clinical staff to benefit from an interdisciplinary team approach to help improve pregnancy care.  The [facility] Maternity Care Conference includes the maternity clinic coordinator (RN), medical providers (MD/APP staff), Care Management -OBCM and Healthy Beginnings, Centering Pregnancy coordinator, Infant Mortality reduction Dietitian.  Nursing staff are also encouraged to participate. The group meets monthly to discuss patient care and coordinate services.   The patient's care care at the agency was reviewed in EMR and high risk factors evaluated in an interdisciplinary approach.    Value added interventions discussed at this care conference today were:   [facility] Note:  Patient resides in [facility]. And receiving care there. Issues with medicaid and potentially not covered Providers/MH Coordinator will be in contact with patient at next visit regarding medicaid  [name]

## 2020-09-01 ENCOUNTER — Telehealth: Payer: Self-pay

## 2020-09-01 ENCOUNTER — Other Ambulatory Visit: Payer: Self-pay

## 2020-09-01 ENCOUNTER — Observation Stay
Admission: EM | Admit: 2020-09-01 | Discharge: 2020-09-01 | Disposition: A | Discharge status: Home / Self Care | Payer: Medicaid Other

## 2020-09-01 ENCOUNTER — Encounter: Payer: Self-pay | Admitting: Obstetrics and Gynecology

## 2020-09-01 ENCOUNTER — Ambulatory Visit (HOSPITAL_COMMUNITY)
Admission: AD | Admit: 2020-09-01 | Discharge: 2020-09-01 | Disposition: A | Discharge status: Home / Self Care | Payer: Medicaid Other | Source: Other Acute Inpatient Hospital | Attending: Obstetrics and Gynecology | Admitting: Obstetrics and Gynecology

## 2020-09-01 DIAGNOSIS — O99012 Anemia complicating pregnancy, second trimester: Secondary | ICD-10-CM

## 2020-09-01 DIAGNOSIS — Z3A29 29 weeks gestation of pregnancy: Secondary | ICD-10-CM | POA: Diagnosis not present

## 2020-09-01 DIAGNOSIS — Z20822 Contact with and (suspected) exposure to covid-19: Secondary | ICD-10-CM | POA: Diagnosis not present

## 2020-09-01 DIAGNOSIS — O4700 False labor before 37 completed weeks of gestation, unspecified trimester: Secondary | ICD-10-CM | POA: Diagnosis present

## 2020-09-01 LAB — CBC
HCT: 31.2 % — ABNORMAL LOW (ref 36.0–46.0)
Hemoglobin: 10.1 g/dL — ABNORMAL LOW (ref 12.0–15.0)
MCH: 22.4 pg — ABNORMAL LOW (ref 26.0–34.0)
MCHC: 32.4 g/dL (ref 30.0–36.0)
MCV: 69.2 fL — ABNORMAL LOW (ref 80.0–100.0)
Platelets: 201 10*3/uL (ref 150–400)
RBC: 4.51 MIL/uL (ref 3.87–5.11)
RDW: 14.7 % (ref 11.5–15.5)
WBC: 8.1 10*3/uL (ref 4.0–10.5)
nRBC: 0 % (ref 0.0–0.2)

## 2020-09-01 LAB — TYPE AND SCREEN
ABO/RH(D): O POS
Antibody Screen: NEGATIVE

## 2020-09-01 LAB — ABO/RH: ABO/RH(D): O POS

## 2020-09-01 LAB — URINALYSIS, COMPLETE (UACMP) WITH MICROSCOPIC
Bilirubin Urine: NEGATIVE
Glucose, UA: NEGATIVE mg/dL
Hgb urine dipstick: NEGATIVE
Ketones, ur: NEGATIVE mg/dL
Leukocytes,Ua: NEGATIVE
Nitrite: NEGATIVE
Protein, ur: NEGATIVE mg/dL
Specific Gravity, Urine: 1.016 (ref 1.005–1.030)
Squamous Epithelial / HPF: NONE SEEN (ref 0–5)
pH: 8 (ref 5.0–8.0)

## 2020-09-01 LAB — WET PREP, GENITAL
Clue Cells Wet Prep HPF POC: NONE SEEN
Trich, Wet Prep: NONE SEEN
Yeast Wet Prep HPF POC: NONE SEEN

## 2020-09-01 LAB — GROUP B STREP BY PCR: Group B strep by PCR: NEGATIVE

## 2020-09-01 LAB — CHLAMYDIA/NGC RT PCR (ARMC ONLY)
Chlamydia Tr: NOT DETECTED
N gonorrhoeae: NOT DETECTED

## 2020-09-01 LAB — RESPIRATORY PANEL BY RT PCR (FLU A&B, COVID)
Influenza A by PCR: NEGATIVE
Influenza B by PCR: NEGATIVE
SARS Coronavirus 2 by RT PCR: NEGATIVE

## 2020-09-01 LAB — FETAL FIBRONECTIN: Fetal Fibronectin: POSITIVE — AB

## 2020-09-01 MED ORDER — BETAMETHASONE SOD PHOS & ACET 6 (3-3) MG/ML IJ SUSP: 12.0000 mg | INTRAMUSCULAR | Status: AC

## 2020-09-01 MED ORDER — TERBUTALINE SULFATE 1 MG/ML IJ SOLN
INTRAMUSCULAR | Status: AC
  Filled 2020-09-01: qty 1

## 2020-09-01 MED ORDER — ACETAMINOPHEN 325 MG PO TABS: 650.0000 mg | ORAL_TABLET | ORAL | Status: DC | PRN

## 2020-09-01 MED ORDER — BETAMETHASONE SOD PHOS & ACET 6 (3-3) MG/ML IJ SUSP
INTRAMUSCULAR | Status: AC
  Administered 2020-09-01: 12 mg via INTRAMUSCULAR
  Filled 2020-09-01: qty 5

## 2020-09-01 MED ORDER — MAGNESIUM SULFATE BOLUS VIA INFUSION
4.0000 g | Freq: Once | INTRAVENOUS | Status: AC
  Filled 2020-09-01: qty 1000

## 2020-09-01 MED ORDER — DOCUSATE SODIUM 100 MG PO CAPS: 100.0000 mg | ORAL_CAPSULE | Freq: Every day | ORAL | Status: DC

## 2020-09-01 MED ORDER — LACTATED RINGERS IV SOLN: INTRAVENOUS | Status: DC

## 2020-09-01 MED ORDER — PRENATAL MULTIVITAMIN CH: 1.0000 | ORAL_TABLET | Freq: Every day | ORAL | Status: DC

## 2020-09-01 MED ORDER — MAGNESIUM SULFATE 40 GM/1000ML IV SOLN
INTRAVENOUS | Status: AC
  Administered 2020-09-01: 4 g via INTRAVENOUS
  Filled 2020-09-01: qty 1000

## 2020-09-01 MED ORDER — SODIUM CHLORIDE 0.9 % IV SOLN
2.0000 g | INTRAVENOUS | Status: AC
  Administered 2020-09-01: 2 g via INTRAVENOUS
  Filled 2020-09-01: qty 2000

## 2020-09-01 MED ORDER — ZOLPIDEM TARTRATE 5 MG PO TABS: 5.0000 mg | ORAL_TABLET | Freq: Every evening | ORAL | Status: DC | PRN

## 2020-09-01 MED ORDER — CALCIUM GLUCONATE 10 % IV SOLN
INTRAVENOUS | Status: AC
  Filled 2020-09-01: qty 10

## 2020-09-01 MED ORDER — INDOMETHACIN 50 MG PO CAPS
50.0000 mg | ORAL_CAPSULE | ORAL | Status: AC
  Administered 2020-09-01: 50 mg via ORAL
  Filled 2020-09-01: qty 1

## 2020-09-01 MED ORDER — MAGNESIUM SULFATE 40 GM/1000ML IV SOLN: 2.0000 g/h | INTRAVENOUS | Status: DC

## 2020-09-01 MED ORDER — CALCIUM CARBONATE ANTACID 500 MG PO CHEW: 2.0000 | CHEWABLE_TABLET | ORAL | Status: DC | PRN

## 2020-09-01 MED ORDER — LACTATED RINGERS IV BOLUS
1000.0000 mL | Freq: Once | INTRAVENOUS | Status: AC
  Administered 2020-09-01: 1000 mL via INTRAVENOUS

## 2020-09-01 NOTE — Telephone Encounter (Signed)
LM for patient to return call.

## 2020-09-01 NOTE — Discharge Summary (Signed)
Patient ID: Diannah Rindfleisch MRN: [id number] DOB/AGE: 02/10/1996 24 y.o.  Admit date: 09/01/2020 Discharge date: 09/01/2020  Admission Diagnoses: preterm labor at [redacted]w[redacted]d  Discharge Diagnoses: same   Prenatal Care Site: [facility]  Prenatal Procedures: NST and ultrasound  Consults: Neonatology, Maternal Fetal Medicine  Significant Diagnostic Studies:  Results for orders placed or performed during the hospital encounter of 09/01/20 (from the past 168 hour(s))  Wet prep, genital   Collection Time: 09/01/20  3:04 PM  Result Value Ref Range   Yeast Wet Prep HPF POC NONE SEEN NONE SEEN   Trich, Wet Prep NONE SEEN NONE SEEN   Clue Cells Wet Prep HPF POC NONE SEEN NONE SEEN   WBC, Wet Prep HPF POC MANY (A) NONE SEEN   Sperm PRESENT   Chlamydia/NGC rt PCR ([facility] only)   Collection Time: 09/01/20  3:04 PM  Result Value Ref Range   Specimen source GC/Chlam ENDOCERVICAL    Chlamydia Tr NOT DETECTED NOT DETECTED   N gonorrhoeae NOT DETECTED NOT DETECTED  Group B strep by PCR   Collection Time: 09/01/20  3:04 PM   Specimen: Genital  Result Value Ref Range   Group B strep by PCR NEGATIVE NEGATIVE  Respiratory Panel by RT PCR (Flu A&B, Covid) - Nasopharyngeal Swab   Collection Time: 09/01/20  3:04 PM   Specimen: Nasopharyngeal Swab  Result Value Ref Range   SARS Coronavirus 2 by RT PCR NEGATIVE NEGATIVE   Influenza A by PCR NEGATIVE NEGATIVE   Influenza B by PCR NEGATIVE NEGATIVE  Fetal fibronectin   Collection Time: 09/01/20  3:04 PM  Result Value Ref Range   Fetal Fibronectin POSITIVE (A) NEGATIVE  Urinalysis, Complete w Microscopic   Collection Time: 09/01/20  3:04 PM  Result Value Ref Range   Color, Urine YELLOW (A) YELLOW   APPearance CLEAR (A) CLEAR   Specific Gravity, Urine 1.016 1.005 - 1.030   pH 8.0 5.0 - 8.0   Glucose, UA NEGATIVE NEGATIVE mg/dL   Hgb urine dipstick NEGATIVE NEGATIVE   Bilirubin Urine NEGATIVE NEGATIVE   Ketones, ur NEGATIVE NEGATIVE mg/dL   Protein, ur  NEGATIVE NEGATIVE mg/dL   Nitrite NEGATIVE NEGATIVE   Leukocytes,Ua NEGATIVE NEGATIVE   RBC / HPF 0-5 0 - 5 RBC/hpf   WBC, UA 0-5 0 - 5 WBC/hpf   Bacteria, UA RARE (A) NONE SEEN   Squamous Epithelial / LPF NONE SEEN 0 - 5   Mucus PRESENT    Sperm, UA PRESENT   Type and screen [facility]   Collection Time: 09/01/20  3:04 PM  Result Value Ref Range   ABO/RH(D) O POS    Antibody Screen NEG    Sample Expiration      09/04/2020,2359 Performed at [facility], [address] [place], [place] [address]   CBC   Collection Time: 09/01/20  4:08 PM  Result Value Ref Range   WBC 8.1 4.0 - 10.5 K/uL   RBC 4.51 3.87 - 5.11 MIL/uL   Hemoglobin 10.1 (L) 12.0 - 15.0 g/dL   HCT 41.6 (L) 36 - 46 %   MCV 69.2 (L) 80.0 - 100.0 fL   MCH 22.4 (L) 26.0 - 34.0 pg   MCHC 32.4 30.0 - 36.0 g/dL   RDW 60.6 30.1 - 60.1 %   Platelets 201 150 - 400 K/uL   nRBC 0.0 0.0 - 0.2 %  ABO/Rh   Collection Time: 09/01/20  4:08 PM  Result Value Ref Range   ABO/RH(D)  O POS Performed at [facility], [address] [place], [place] [address]     Treatments:  -IV hydration -antibiotics: ampicillin -steroids: betamethasone -mag sulfate infusion for neuroprophylaxis    Hospital Course:  This is a 24 y.o. [id number] with IUP at [redacted]w[redacted]d was observed for preterm labor.  She presented to L&D with painful contractions that started earlier the same day.  She was found to have a cervical exam of 2-3/70/-2, cephalic by bedside US.  No leaking of fluid and no bleeding.  She was initially started on magnesium sulfate for tocolysis and neuroprotection and also received betamethasone x 1 doses.    Transfer to tertiary center for preterm labor was initiated, with Dr. Artemio Aly [facility] accepting.  She was given indomethacin 50mg  x 1 dose for tocolysis and Ampicillin 2 grams IVPB for pending GBS status.    Jazman's cervical exam was unchanged immediately prior to  transfer.  She was transferred via [facility] in stable condition to [facility].    Discharge Physical Exam:  BP 101/70 (BP Location: Left Arm)   Pulse 95   Temp 98.1 F (36.7 C) (Oral)   Resp 16   Ht 4\' 11"  (1.499 m)   Wt 47.6 kg   LMP 02/09/2020   SpO2 100%   BMI 21.21 kg/m   General: NAD CV: RRR Pulm: CTABL, nl effort ABD: s/nd/nt, gravid DVT Evaluation: LE non-ttp, no evidence of DVT on exam.  SVE: Dilation: 2.5 Effacement (%): 70 Station: -2 Presentation: Undeterminable Exam by:: 02/11/2020, CNM Fetal monitoring: Baseline: 130 bpm, Variability: moderate, Accelerations: present 10x10 and Decelerations: Variable: intermittent  TOCO: regular, every 2-5 minutes; mild to moderate to palpation    Discharge Condition: Stable - transferred via [facility] to [facility] L&D   Disposition: Discharge disposition: 95-DC/txfr to another health care institution with planned acute care hosp IP readmit       Allergies as of 09/01/2020   No Known Allergies     Medication List    TAKE these medications   ferrous sulfate 324 (65 Fe) MG Tbec Take 1 tablet (325 mg total) by mouth daily for 100 doses. Take with vitamin C juice What changed: additional instructions   multivitamin-prenatal 27-0.8 MG Tabs tablet Take 1 tablet by mouth daily at 12 noon.   progesterone 100 MG vaginal insert Commonly known as: ENDOMETRIN Place 1 tablet (100 mg total) vaginally 2 (two) times daily.        Signed:  [facility] 09/01/2020 5:11 PM ----- Gustavo Lah, CNM Certified Nurse Midwife [facility]

## 2020-09-01 NOTE — H&P (Signed)
OB History & Physical   History of Present Illness:  Chief Complaint:   HPI:  Angela Holloway is a 24 y.o. [id number] female at 232w2d dated by LMP, c/w US at 8049w6d.  She presents to L&D for painful contractions   Reports active fetal movement  Contractions: every 2 to 3 minutes starting around 1300 today  LOF/SROM: denies  Vaginal bleeding: denies   Pregnancy Issues: 1. History of preterm birth - 27 weeks and 30 weeks  2. Physiologic anemia of pregnancy  3. Asthma  4. Late prenatal care - initiated at 18 weeks   Patient Active Problem List   Diagnosis Date Noted  . Preterm contractions 09/01/2020  . Supervision of high risk pregnancy in first trimester 06/14/2020  . History of premature delivery x2 at 28 wks 02/2017 & 05/2018 06/14/2020  . Short stature 06/14/2020  . Late prenatal care complicating pregnancy, unspecified trimester 06/14/2020  . Anemia affecting pregnancy 06/14/2020  . Asthma during pregnancy with last attack 6 years ago 06/14/2020     Maternal Medical History:   Past Medical History:  Diagnosis Date  . Anemia    both pregnancies    Past Surgical History:  Procedure Laterality Date  . denies surgical history      No Known Allergies  Prior to Admission medications   Medication Sig Start Date End Date Taking? Authorizing Provider  ferrous sulfate 324 (65 Fe) MG TBEC Take 1 tablet (325 mg total) by mouth daily for 100 doses. Take with vitamin C juice Patient taking differently: Take 1 tablet by mouth daily. Take with vitamin C juice, has been taking BID 06/14/20 09/22/20  Sciora, Austin MilesElizabeth A, CNM  Prenatal Vit-Fe Fumarate-FA (MULTIVITAMIN-PRENATAL) 27-0.8 MG TABS tablet Take 1 tablet by mouth daily at 12 noon.    [provider]  progesterone (ENDOMETRIN) 100 MG vaginal insert Place 1 tablet (100 mg total) vaginally 2 (two) times daily. 06/23/20   Georgana CurioBooker, Corenthian Jerome, MD     Prenatal care site:  [facility]  Social History: She  reports that she has  never smoked. She has never used smokeless tobacco. She reports previous alcohol use. She reports previous drug use.  Family History: family history includes Asthma in her brother; Depression in her brother; Diabetes in her mother; Heart murmur in her brother.   Review of Systems: A full review of systems was performed and negative except as noted in the HPI.     Physical Exam:  Vital Signs: BP 106/63 (BP Location: Left Arm)   Pulse 90   Temp 98 F (36.7 C) (Oral)   Resp 16   Ht 4\' 11"  (1.499 m)   Wt 47.6 kg   LMP 02/09/2020   BMI 21.21 kg/m  Physical Exam  General: no acute distress.  HEENT: normocephalic, atraumatic Heart: regular rate & rhythm.  No murmurs/rubs/gallops Lungs: clear to auscultation bilaterally, normal respiratory effort Abdomen: soft, gravid, non-tender;  EFW 902grams on 08/15/2020 Pelvic:   External: Normal external female genitalia  Cervix: Dilation: 2.5 / Effacement (%): 70 / Station: -2    Extremities: non-tender, symmetric, no edema bilaterally.  DTRs: 2+/2+  Neurologic: Alert & oriented x 3.    Results for orders placed or performed during the hospital encounter of 09/01/20 (from the past 24 hour(s))  Fetal fibronectin     Status: Abnormal   Collection Time: 09/01/20  3:04 PM  Result Value Ref Range   Fetal Fibronectin POSITIVE (A) NEGATIVE  Urinalysis, Complete w Microscopic  Status: Abnormal   Collection Time: 09/01/20  3:04 PM  Result Value Ref Range   Color, Urine YELLOW (A) YELLOW   APPearance CLEAR (A) CLEAR   Specific Gravity, Urine 1.016 1.005 - 1.030   pH 8.0 5.0 - 8.0   Glucose, UA NEGATIVE NEGATIVE mg/dL   Hgb urine dipstick NEGATIVE NEGATIVE   Bilirubin Urine NEGATIVE NEGATIVE   Ketones, ur NEGATIVE NEGATIVE mg/dL   Protein, ur NEGATIVE NEGATIVE mg/dL   Nitrite NEGATIVE NEGATIVE   Leukocytes,Ua NEGATIVE NEGATIVE   RBC / HPF 0-5 0 - 5 RBC/hpf   WBC, UA 0-5 0 - 5 WBC/hpf   Bacteria, UA RARE (A) NONE SEEN   Squamous Epithelial  / LPF NONE SEEN 0 - 5   Mucus PRESENT    Sperm, UA PRESENT   Wet prep, genital     Status: Abnormal   Collection Time: 09/01/20  3:04 PM  Result Value Ref Range   Yeast Wet Prep HPF POC NONE SEEN NONE SEEN   Trich, Wet Prep NONE SEEN NONE SEEN   Clue Cells Wet Prep HPF POC NONE SEEN NONE SEEN   WBC, Wet Prep HPF POC MANY (A) NONE SEEN   Sperm PRESENT   Type and screen [facility]     Status: None (Preliminary result)   Collection Time: 09/01/20  3:04 PM  Result Value Ref Range   ABO/RH(D) PENDING    Antibody Screen PENDING    Sample Expiration      09/04/2020,2359 Performed at [facility], [address] [place], [place] [address]     Pertinent Results:  Prenatal Labs: Blood type/Rh O pos  Antibody screen neg  Rubella Pending   Varicella Pending    RPR NR  HBsAg Neg  HIV NR  GC neg  Chlamydia neg  Genetic screening Not done   1 hour GTT Not done   3 hour GTT N/A  GBS Pending    FHT: Baseline: 130 bpm, Variability: moderate, Accelerations: present, 10x10 and Decelerations: Variable: intermittent  TOCO: regular, every 2-5 minutes SVE:  Dilation: 2.5 / Effacement (%): 70 / Station: -2    Cephalic by [place] - previously breech on 08/15/2020  [place] MFM OB FOLLOW UP  Result Date: 08/15/2020 ----------------------------------------------------------------------  OBSTETRICS REPORT                       (Signed Final 08/15/2020 09:39 am) ---------------------------------------------------------------------- Patient Info  ID #:       [id number]                          D.O.B.:  1996/09/11 (24 yrs)  Name:       Angela Holloway                     Visit Date: 08/15/2020 08:22 am ---------------------------------------------------------------------- Performed By  Attending:        Noralee Space MD        Ref. Address:     [address]                                                             [address],  [place], [place]                                                             [address]  Performed By:     Neita Carp RDMS        [facility]         [facility]                                                             [facility]  Referred By:      Alberteen Spindle CNM ---------------------------------------------------------------------- Orders  #  Description                           Code        Ordered By  1  [place] MFM OB FOLLOW UP                   23536.14    Arnetha Courser ----------------------------------------------------------------------  #  Order #                     Accession #                Episode #  1  [id number]                   [id number]                 [id number] ---------------------------------------------------------------------- Indications  [redacted] weeks gestation of pregnancy                Z3A.26  Prior poor obstetrical history antepartum,     O09.292  second trimester  Poor obstetric history: Previous preterm       O09.219  delivery, antepartum  History of shortened cervix ---------------------------------------------------------------------- Fetal Evaluation  Num Of Fetuses:         1  Fetal Heart Rate(bpm):  130  Cardiac Activity:       Observed  Presentation:           Breech  Placenta:               Posterior  Largest Pocket(cm)                              3.77 ---------------------------------------------------------------------- Biometry  BPD:        65  mm     G. Age:  26w 2d         20  %    CI:        71.79   %    70 - 86                                                          FL/HC:      19.5   %    18.6 - 20.4  HC:      244.2  mm     G. Age:  26w 4d         14  %    HC/AC:      1.12        1.05 - 1.21  AC:      218.3  mm     G. Age:  26w 2d         25  %    FL/BPD:     73.2   %    71 -  87  FL:       47.6  mm     G. Age:  25w 6d         12  %    FL/AC:      21.8   %    20 - 24  LV:        7.1  mm  Est. FW:     902  gm           2 lb     16  % ---------------------------------------------------------------------- Gestational Age  LMP:           26w 6d        Date:  02/09/20                 EDD:   11/15/20  U/S Today:     26w 2d                                        EDD:   11/19/20  Best:          26w 6d     Det. By:  LMP  (02/09/20)          EDD:   11/15/20 ---------------------------------------------------------------------- Anatomy  Cranium:               Previously seen        Aortic Arch:            Previously seen  Cavum:                 Appears normal         Ductal Arch:            Previously seen  Ventricles:            Appears normal         Diaphragm:  Previously seen  Choroid Plexus:        Previously seen        Stomach:                Appears normal, left                                                                        sided  Cerebellum:            Previously seen        Abdomen:                Previously seen  Posterior Fossa:       Previously seen        Abdominal Wall:         Appears nml (cord                                                                        insert, abd wall)  Nuchal Fold:           Previously seen        Cord Vessels:           Previously seen  Face:                  Appears normal         Kidneys:                Appear normal                         (orbits and profile)  Lips:                  Previously seen        Bladder:                Appears normal  Heart:                 Appears normal         Spine:                  Previously seen                         (4CH, axis, and                         situs)  RVOT:                  Appears normal         Upper Extremities:      Previously seen  LVOT:                  Appears normal         Lower Extremities:      Previously seen  ---------------------------------------------------------------------- Cervix Uterus Adnexa  Cervix  Length:  5.66  cm. ---------------------------------------------------------------------- Impression  History of preterm delivery.  Patient return for fetal growth  assessment.  She takes weekly 17 OH progesterone  injections.  Amniotic fluid is normal and good fetal activity is seen .Fetal  growth is appropriate for gestational age .  The estimated  fetal weight is at the 16th percentile (41st percentile on  previous ultrasound).  We reassured the patient of the findings.  Patient will be  screening for gestational diabetes next week. ---------------------------------------------------------------------- Recommendations  -An appointment was made for her to return in 6 weeks for  fetal growth assessment. ----------------------------------------------------------------------                  Noralee Space, MD Electronically Signed Final Report   08/15/2020 09:39 am ----------------------------------------------------------------------   Assessment:  Kemiyah Tarazon is a 24 y.o. Z6X0960 female at [redacted]w[redacted]d with preterm labor .   Plan:  1. Admit to Labor & Delivery; consents reviewed and obtained - Covid admission screen  - Dr. Dalbert Garnet notified of plan  - [facility] accepted transfer for preterm labor  - Transportation by [facility]   2. Fetal Well being  - Fetal Tracing: Cat II for occasional variables  - Group B Streptococcus ppx indicated: Amp 2grams ordered, GBS pending  - Presentation: cephalic confirmed by bedside US    3. High risk OB: - Prenatal labs reviewed, as above - Rh pos - CBC, T&S, RPR on admit - Clear fluids, IVF - Rubella and Varicella pending  - GBS, GC/CT, and wet prep pending  - FFN pos  4. Preterm labor  -  Contractions monitored with external toco -  Pelvis proven to 2268 grams  -  Plan for transfer to [facility] - Accepting physician Dr. Artemio Aly  -  Plan  for  continuous fetal monitoring until transfer -  Magnesium sulfate infusion started for neuroprophylaxis  -  Betamethasone 1st dose given   -  Amp 2grams IV started for unknown GBS status  -  Indomethacin  given for tocolysis per MFM recommendations     Gustavo Lah, CNM 09/01/20 4:06 PM  Margaretmary Eddy, CNM Certified Nurse Midwife [facility]

## 2020-09-01 NOTE — Progress Notes (Signed)
[facility] at bedside.  Cervical exam unchanged and vital signs stable.  Will proceed with transfer to [facility] for preterm labor.    Margaretmary Eddy, CNM Certified Nurse Midwife [facility]

## 2020-09-01 NOTE — Telephone Encounter (Signed)
Angela Holloway called in and stated that she is being seen at the HD. [name] is having lower back pain and they requested that she call us to see if we can see her. The pt is 29 weeks, she said that the pain started last night. I told [place] that I will send a message and that we will have to ask where we can schedule you. The pt verbally understood. Please advise    I see Dr. Michel Harrow has some afternoon today and Valentino Saxon has a  spot tomorrow. Please let me know what we can do

## 2020-09-01 NOTE — Plan of Care (Signed)
24 y.o. pregnant woman reports to labor and delivery for evaluation. She is [id number] and [redacted]w[redacted]d. Pt. Reports regular contractions rating them 7-8 on a scale of 1-10. States that her other 2 pregnancies results in preterm births. Pt. States that these contractions feel similar to prior labors. Will continue to evaluate and review plan of care with provider.

## 2020-09-01 NOTE — OB Triage Note (Addendum)
Pt reports back pain and ctx since last night. Denies any vaginal bleeding or LOF. Still able to feel baby move. Has a history of 2 pre term deliveries at 60 and 30 weeks.

## 2020-09-01 NOTE — Progress Notes (Signed)
RN requested [facility] transport team at 1557. RN gave report to charge RN on [facility] at [phone]. [facility] transport team arrived at 1642. CNM rechecked patient at 1644 and patient was unchanged at 2.5/70/-2. Patient left facility with transport team at 1705.

## 2020-09-02 LAB — RUBELLA SCREEN: Rubella: 5.59 index (ref >0.99)

## 2020-09-02 LAB — URINE CULTURE: Culture: NO GROWTH

## 2020-09-02 LAB — RPR: RPR Ser Ql: NONREACTIVE

## 2020-09-02 LAB — VARICELLA ZOSTER ANTIBODY, IGG: Varicella IgG: 255 index (ref >165)

## 2020-09-04 DIAGNOSIS — O0993 Supervision of high risk pregnancy, unspecified, third trimester: Secondary | ICD-10-CM

## 2020-09-05 NOTE — Telephone Encounter (Signed)
Patient went to ED

## 2020-09-06 ENCOUNTER — Ambulatory Visit: Payer: Medicaid Other

## 2020-09-26 ENCOUNTER — Ambulatory Visit: Payer: Medicaid Other

## 2020-10-03 ENCOUNTER — Ambulatory Visit: Payer: Medicaid Other | Admitting: Advanced Practice Midwife

## 2020-10-03 ENCOUNTER — Encounter: Payer: Self-pay | Admitting: Advanced Practice Midwife

## 2020-10-03 ENCOUNTER — Other Ambulatory Visit: Payer: Self-pay

## 2020-10-03 LAB — HEMOGLOBIN, FINGERSTICK: Hemoglobin: 10.7 g/dL — ABNORMAL LOW (ref 11.1–15.9)

## 2020-10-03 MED ORDER — FERROUS SULFATE 325 (65 FE) MG PO TABS: 325.0000 mg | ORAL_TABLET | Freq: Every day | ORAL | 0 refills | Status: AC

## 2020-10-03 NOTE — Progress Notes (Signed)
Does not know health history of grandparents. Jossie Ng, RN  Hgb - 10.7 and iron dispensed (supply low at home). Counseled to take BID and client correctly verbalizes how to take iron with orange juice and separate from PNV. Appt for hgb recheck in 4 weeks scheduled for 10/31/2020 and reminder card given. Jossie Ng, RN

## 2020-10-03 NOTE — Progress Notes (Signed)
Post Partum Exam  Angela Holloway is a 24 y.o. SOF nonsmoker [id number] (3,2, infant) female who presents for a postpartum visit. She is 4 weeks postpartum following a spontaneous vaginal delivery. I have fully reviewed the prenatal and intrapartum course. The delivery was at 29 5/7 gestational weeks after MgSo4, Indocin, and BMZ on 09/01/20 and 09/02/20.  SVD on 09/04/20 at 29 5/7 wks F with EBL: 300 and Liletta inserted post placenta delivery at [facility]..  Anesthesia: epidural. Postpartum course has been wnl. Baby's course has been in [facility]. Baby is feeding by Bottle Bleeding red  Scant lochia. Bowel function is normal. Bladder function is normal. Patient is not sexually active. Contraception method is IUD. Living with FOB and her 2 children; infant may come home on [facility].  Pumping q 4 hours (2 oz) and visits infant q 2 days.    Postpartum depression screening:  Edinburgh Postnatal Depression Scale - 10/03/20 1515      Edinburgh Postnatal Depression Scale:  In the Past 7 Days   I have been able to laugh and see the funny side of things. 3    I have looked forward with enjoyment to things. 0    I have blamed myself unnecessarily when things went wrong. 0    I have been anxious or worried for no good reason. 0    I have felt scared or panicky for no good reason. 0    Things have been getting on top of me. 0    I have been so unhappy that I have had difficulty sleeping. 0    I have felt sad or miserable. 1    I have been so unhappy that I have been crying. 1    The thought of harming myself has occurred to me. 0    Edinburgh Postnatal Depression Scale Total 5            The following portions of the patient's history were reviewed and updated as appropriate: allergies, current medications, past family history, past medical history, past social history, past surgical history and problem list. Last pap smear done 06/14/20 and was Normal  Review of Systems Pertinent items are noted in HPI.     Objective:  BP 95/61   Pulse 69   Temp 98 F (36.7 C) (Oral)   Ht 4\' 11"  (1.499 m)   Wt 97 lb 6.4 oz (44.2 kg)   Breastfeeding No   BMI 19.67 kg/m   Gen: well appearing, NAD HEENT: no scleral icterus CV: RR Lung: Normal WOB Breast:performed-not indicated  Ext: warm well perfused  GU: abdomen good tone, soft without tenderness External genitalia--vagina poor tone, pink Cx Liletta string visualized Uterus--sl enlarged, not entirely involuted Rectal: performed -  not indicated       Assessment:    4 wks postpartum exam. Pap smear not done at today's visit.   Plan:   Essential components of care per ACOG recommendations for Comprehensive Postpartum exam:  1.  Mood and well being: Patient with negative depression screening today. Reviewed local resources for support. EPDS is low risk. Reviewed resources and that mood sx in first year after pregnancy are considered related to pregnancy and to reach out for help at [facility] if needed. Discussed [facility] as link to care and availability of LCSW for counseling  - Patient does not use tobacco. If using tobacco we discussed reduction and for recently cessation risk of relapse - hx of drug use? No   If yes,  discussed support systems in place  2. Infant care and feeding:  -Patient currently breastmilk feeding? No, pumping q 4 hours (2 oz) and visits infant q 2 days. If breastmilk feeding discussed return to work and pumping. If needed, patient was provided letter for work to allow for every 2-3 hr pumping breaks, and to be granted a private location to express breastmilk and refrigerated area to store breastmilk. Reviewed importance of draining breast regularly to support lactation. I  -Recommended patient engage with WIC/BFpeer counselors  -Counseled to sign new child up for [facility] services -Social determinants of health (SDOH) reviewed in EPIC. No concerns  3. Sexuality, contraception and birth spacing  Contraception: Contraception  counseling: Reviewed all forms of birth control options in the tiered based approach. available including abstinence; over the counter/barrier methods; hormonal contraceptive medication including pill, patch, ring, injection,contraceptive implant; hormonal and nonhormonal IUDs; permanent sterilization options including vasectomy and the various tubal sterilization modalities. Risks, benefits, and typical effectiveness rates were reviewed.  Questions were answered.  Written information was also given to the patient to review.  Patient desires has [name] which was inserted post placenta delivery, this was prescribed for patient. She will follow up in 1 year for surveillance.  She was told to call with any further questions, or with any concerns about this method of contraception.  Emphasized use of condoms 100% of the time for STI prevention.  Patient was offered [name]. [name] was not accepted by the patient. [name] counseling was not given - see RN documentation  - Patient does not want a pregnancy in the next year.  Desired family size is 3 children.  - Reviewed forms of contraception in tiered fashion. Patient desired IUD today.   - Discussed birth spacing of 18 months  4. Sleep and fatigue -Encouraged family/partner/community support of 4 hrs of uninterrupted sleep to help with mood and fatigue  5. Physical Recovery  - Discussed patients delivery and complications - Patient had a first degree laceration, perineal healing reviewed. Patient expressed understanding - Patient has urinary incontinence? No  - Patient is safe to resume physical and sexual activity  6.  Health Maintenance/Chronic Disease - Last pap smear performed on 06/14/20 and was normal with negative HPV.   1. Postpartum exam 4 wks pp.  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

## 2020-10-19 ENCOUNTER — Ambulatory Visit: Payer: Medicaid Other

## 2020-10-31 ENCOUNTER — Other Ambulatory Visit: Payer: Medicaid Other

## 2020-11-03 ENCOUNTER — Telehealth: Payer: Self-pay

## 2020-11-03 NOTE — Telephone Encounter (Signed)
Return call to client who requests missed appt for hgb check (following post-partum appt) be rescheduled. Client requested appt be scheduled for late pm 11/11/2020 and so scheduled. Per client, infant is coming home from hospital today and weight 4 pounds. Jossie Ng, RN

## 2020-11-11 ENCOUNTER — Other Ambulatory Visit: Payer: Medicaid Other

## 2020-12-13 NOTE — Addendum Note (Signed)
Addended by: Heywood Bene on: 12/13/2020 02:47 PM   Modules accepted: Orders

## 2021-03-30 ENCOUNTER — Ambulatory Visit: Payer: Medicaid Other

## 2021-07-22 IMAGING — US US MFM OB LIMITED
1 series · 15 of 26 positions shown · non-contrast
Comparison: none

[Series 1: us mfm ob limited · 26 acquisitions, 15 frames shown]
[im 1/26]
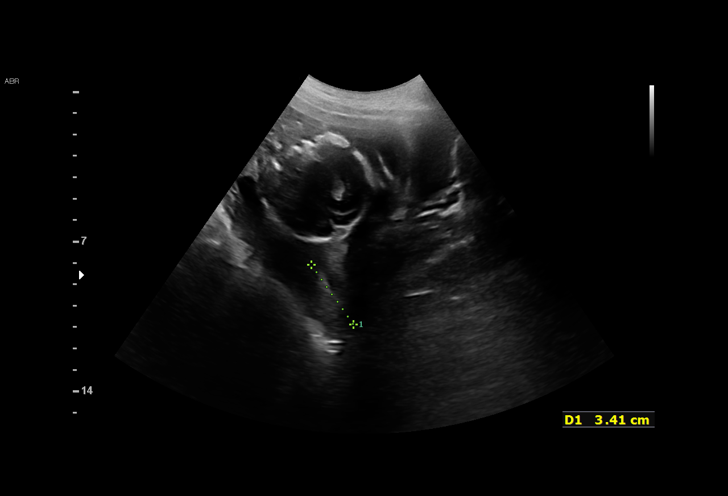
[im 3/26]
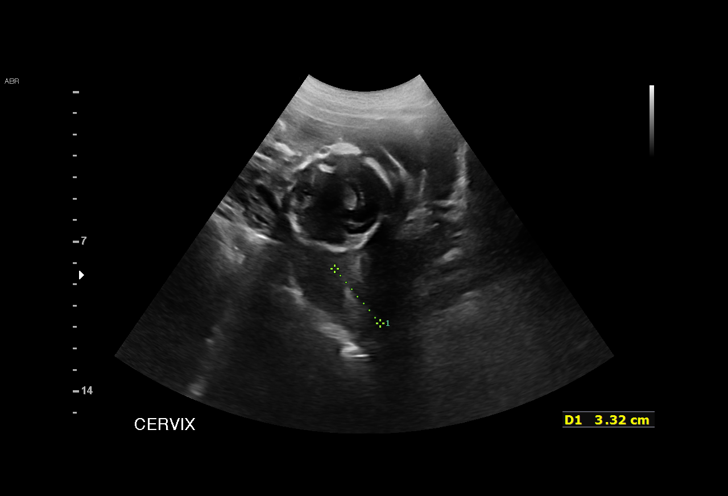
[im 5/26]
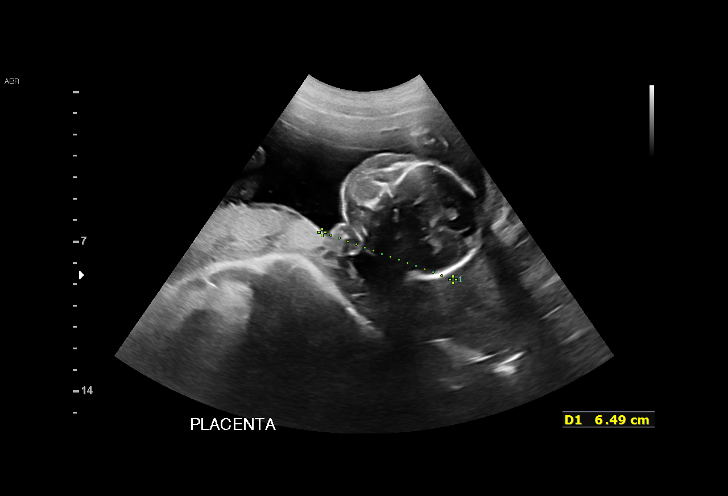
[im 7/26]
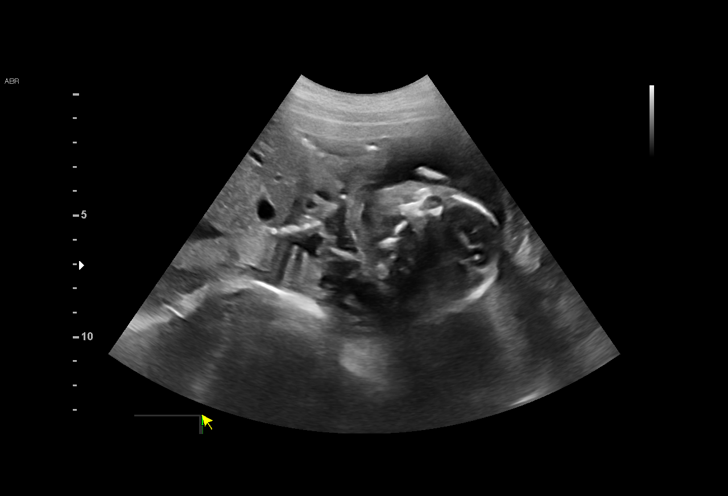
[im 8/26]
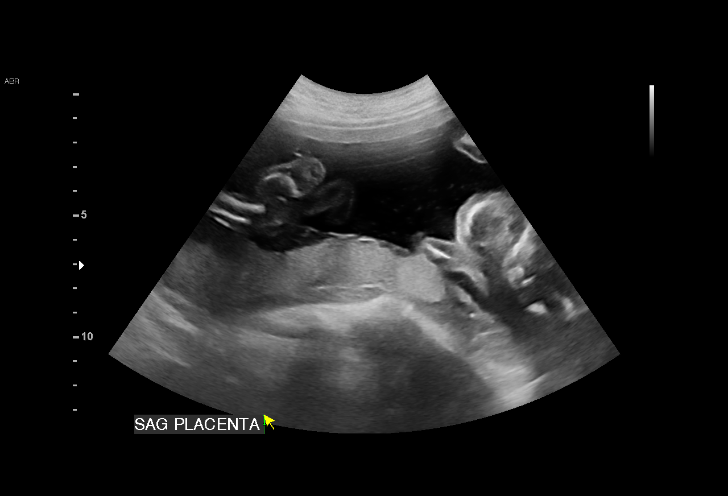
[im 10/26]
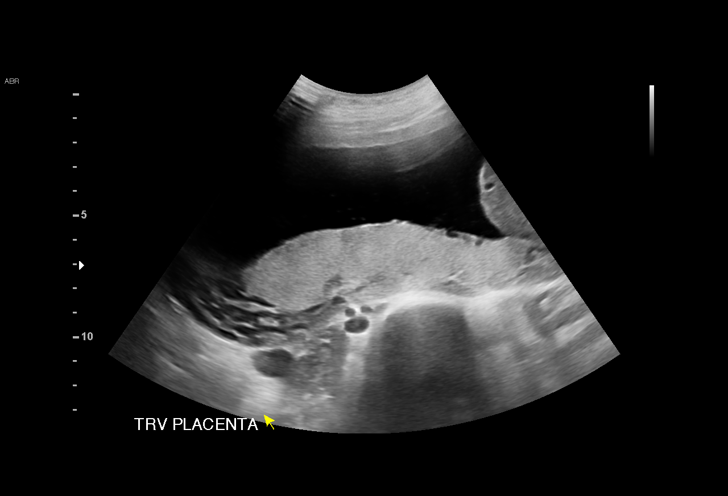
[im 12/26]
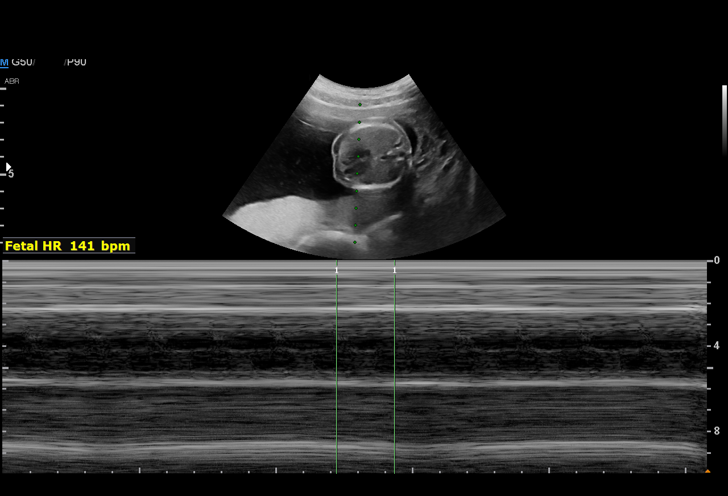
[im 14/26]
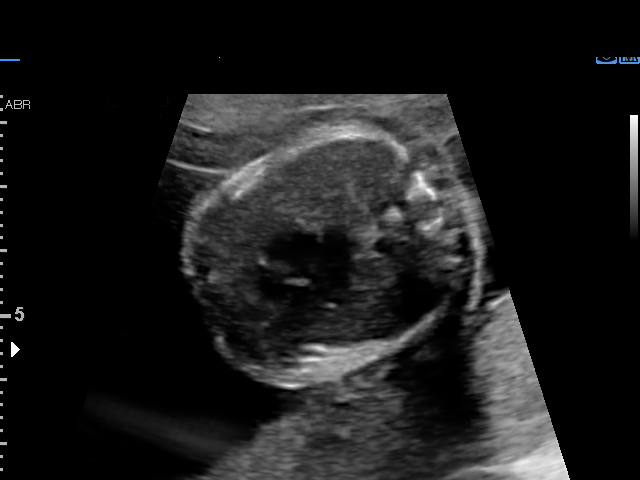
[im 15/26]
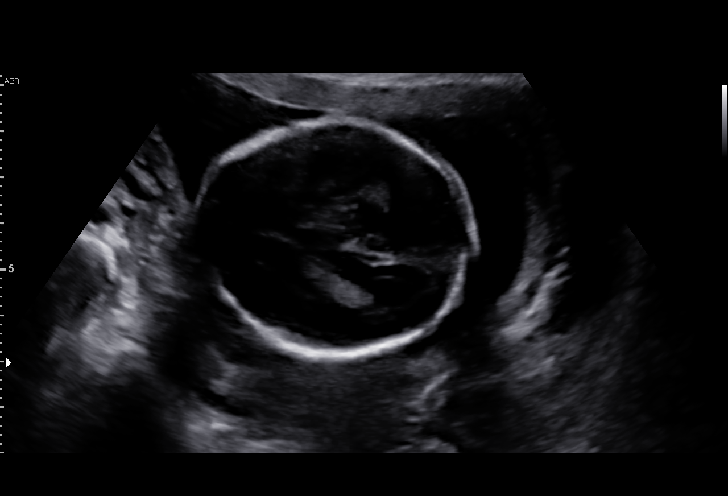
[im 17/26]
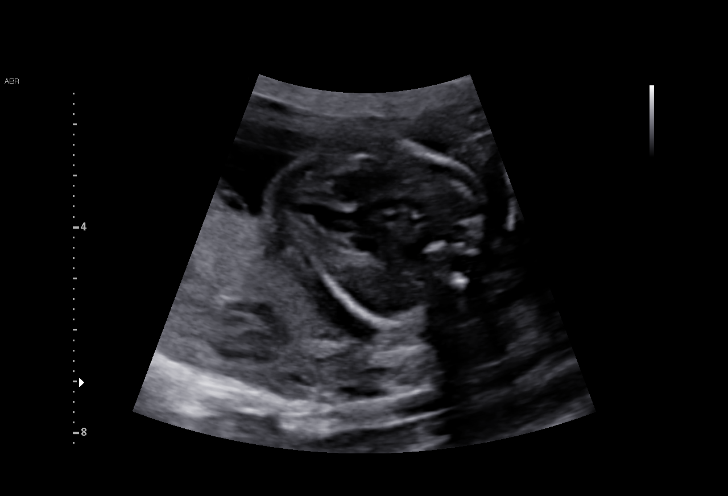
[im 19/26]
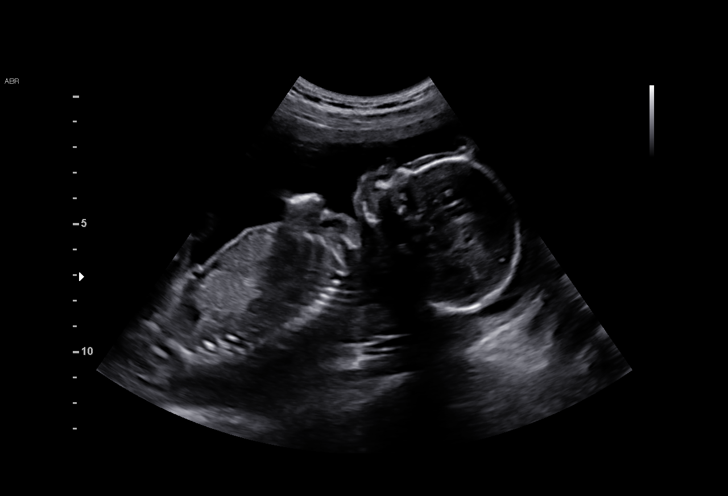
[im 20/26]
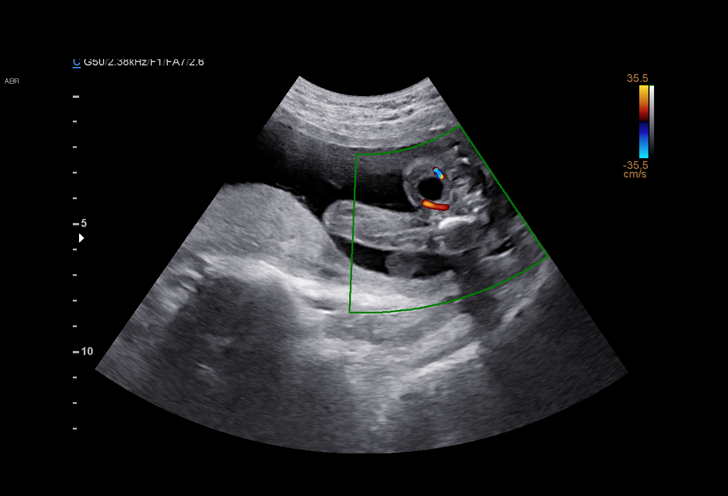
[im 22/26]
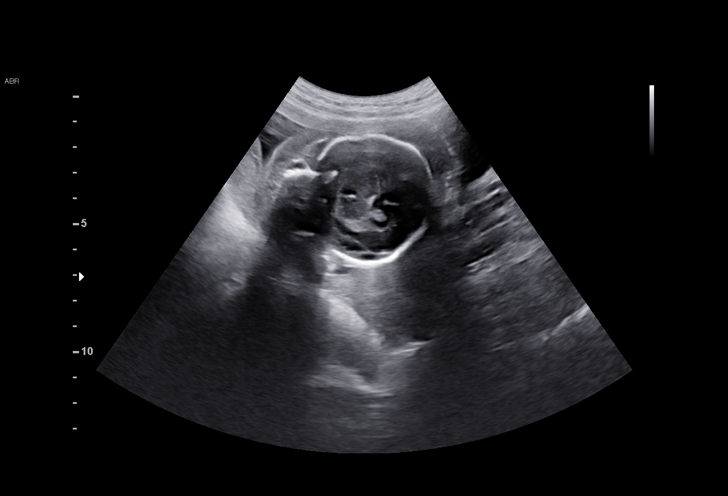
[im 24/26]
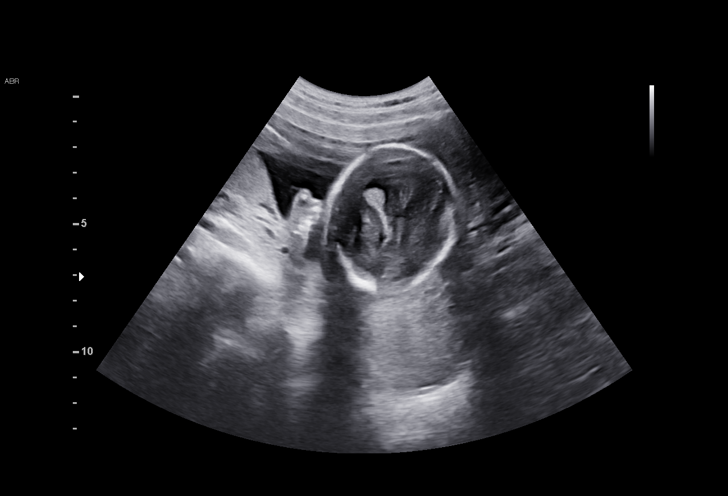
[im 26/26]
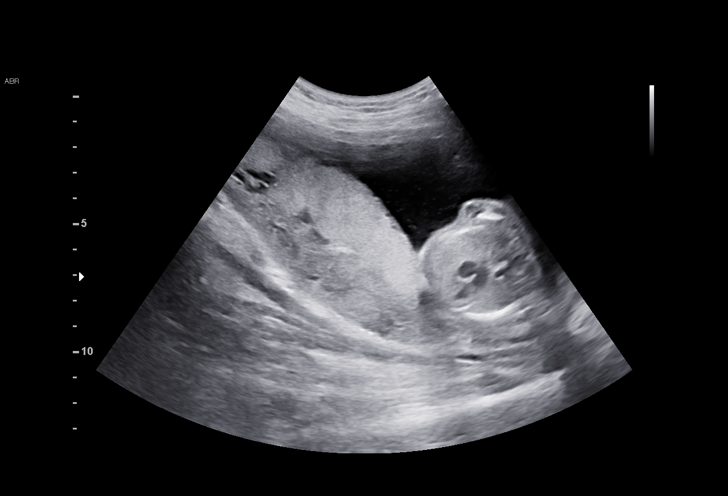

[15 of 26 positions shown; findings below may reference images not displayed]

Maternal [HOSPITAL]
                                                              at [REDACTED]
 Referred By:       MYESHA GOLIGHTLY
                    ALICE CNM

 1   US MFM OB LIMITED                    76815.01     FELNER CEOLA

Indications

 Prior poor obstetrical history antepartum,
 second trimester
 21 weeks gestation of pregnancy
 History of short cervix in pregnancy
 History of PROM
 History of preterm delivery
Fetal Evaluation

 Num Of Fetuses:          1
 Fetal Heart Rate(bpm):   141
 Cardiac Activity:        Observed
 Presentation:            Cephalic
 Placenta:                Posterior
 P. Cord Insertion:       Previously Visualized
Gestational Age

 LMP:            21w 2d       Date:  02/09/20                   EDD:  11/15/20
 Best:           21w 2d    Det. By:  LMP  (02/09/20)            EDD:  11/15/20
Cervix Uterus Adnexa
 Cervix
 Length:             3.7  cm.
 Appears WNL
Comments

 This patient was seen for a cervical length measurement due to
 a history of a 28-week and a 30-week preterm delivery.  Due to
 this history, she is currently being treated with daily vaginal
 progesterone.  She denies any problems since her last exam.

 A limited ultrasound performed today shows normal amniotic
 fluid.  The fetus is in the vertex presentation.

 On a transabdominal measurement, her cervical length was
 cm long without any signs of funneling.  The patient was
 reassured that based on today's cervical length measurement,
 her risk of a preterm birth is low at this time.

 Another growth ultrasound and cervical length measurement
 was scheduled in 2 weeks.  She should continue using the daily
 vaginal progesterone.

## 2021-08-30 IMAGING — US US MFM OB FOLLOW-UP
2 series · 14 of 28 positions shown · non-contrast
Comparison: none

[Series 1: us mfm ob follow-up · 13 of 29 slices shown (1 of 2)]
[im 2/29]
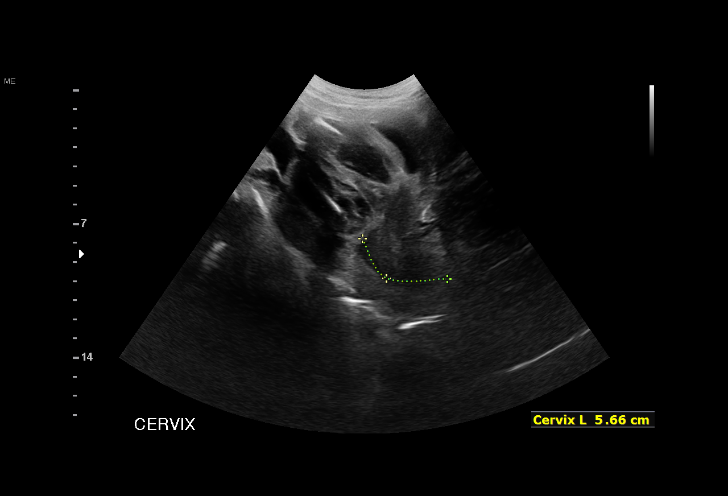
[im 4/29]
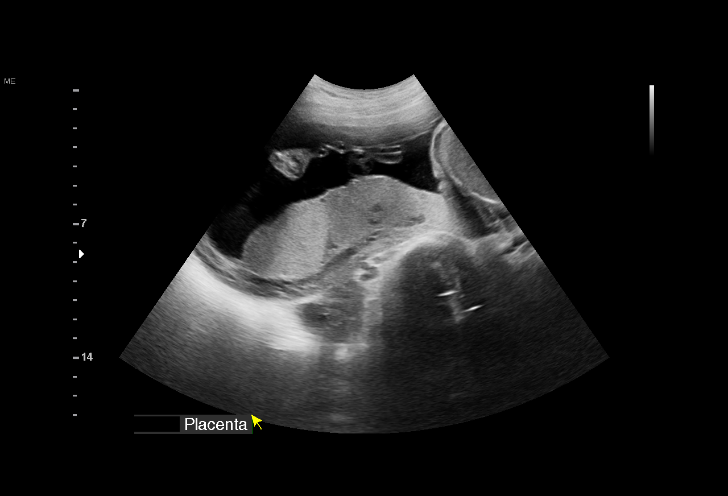
[im 6/29]
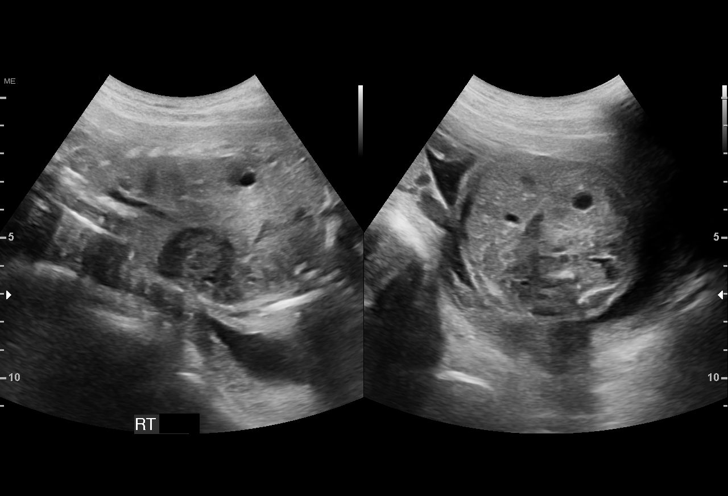
[im 8/29]
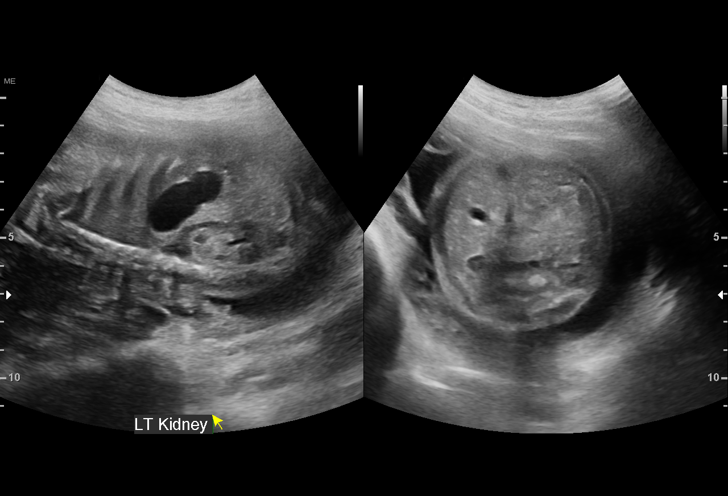
[im 11/29]
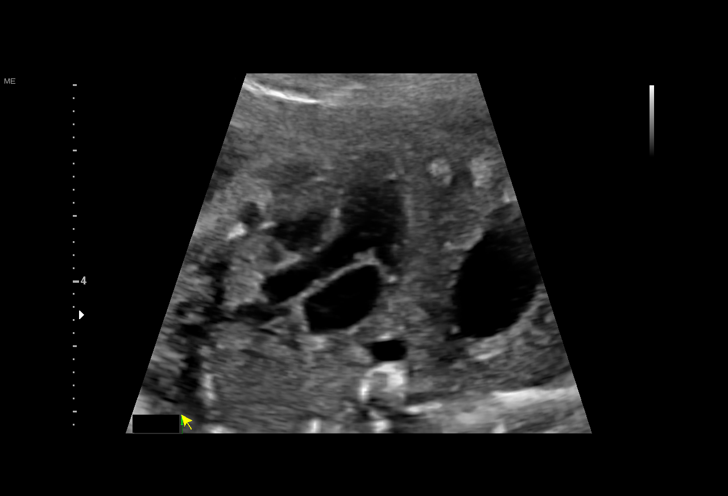
[im 13/29]
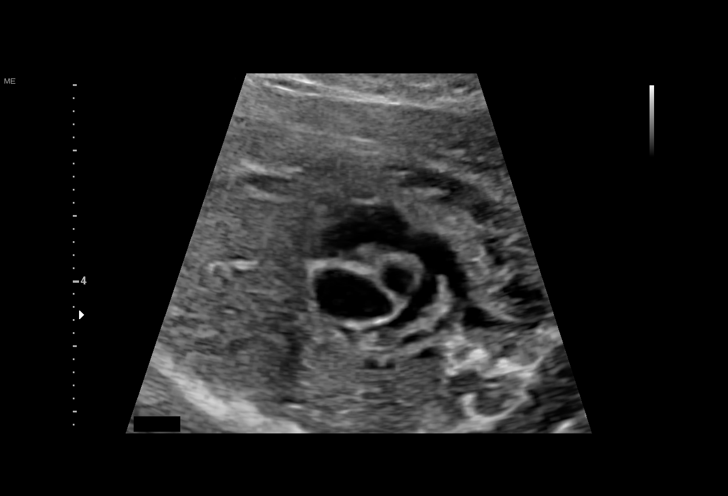
[im 15/29]
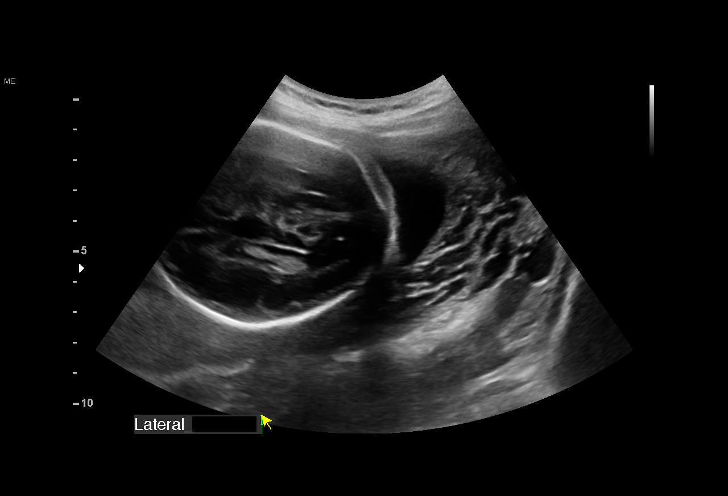
[im 17/29]
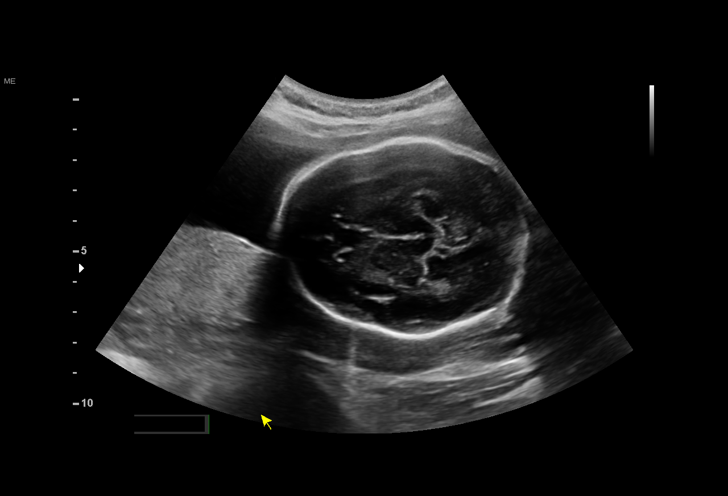
[im 20/29]
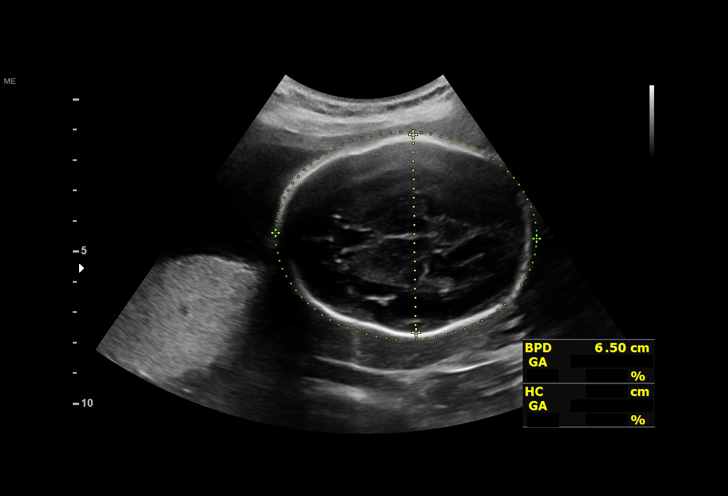
[im 22/29]
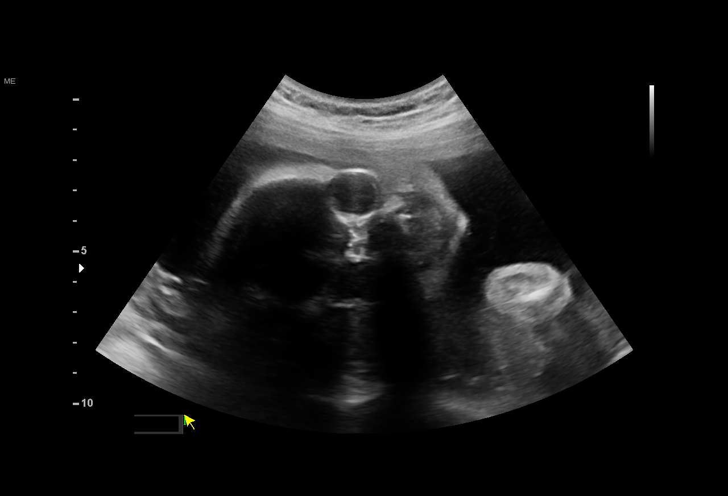
[im 24/29]
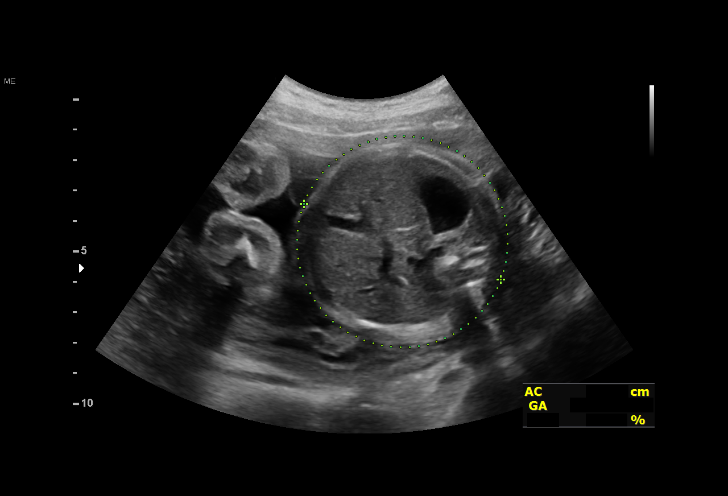
[im 26/29]
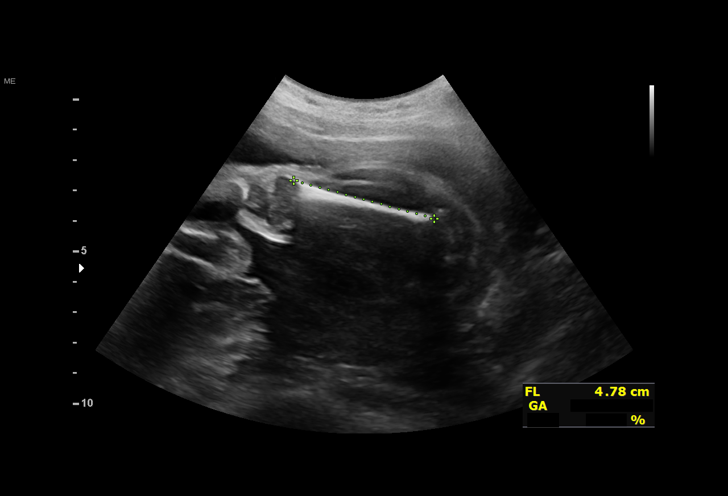
[im 29/29]
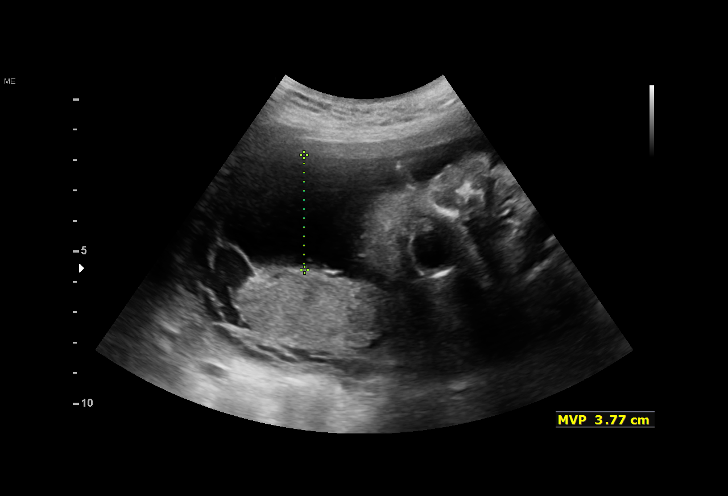

[Series 3: us mfm ob follow-up · 1 of 2 slices shown (2 of 2)]
[im 2/2]
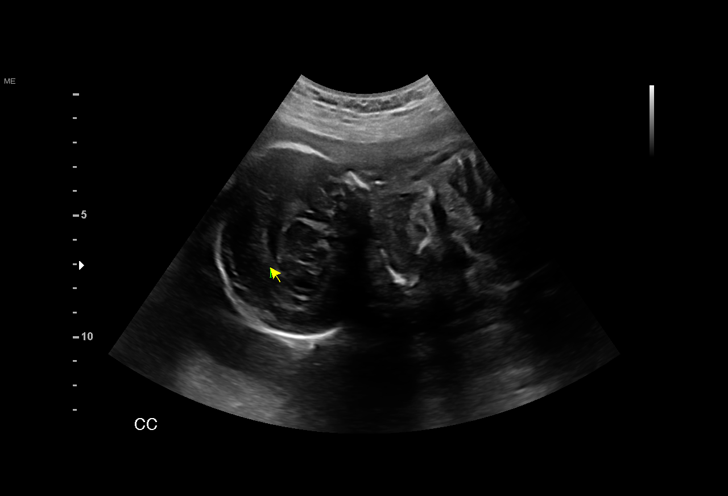

[14 of 28 positions shown; findings below may reference images not displayed]

Maternal [HOSPITAL]
                                                            at [REDACTED]
                   BLONDINACKA CNM

Indications

 26 weeks gestation of pregnancy
 Prior poor obstetrical history antepartum,
 second trimester
 Poor obstetric history: Previous preterm
 delivery, antepartum
 History of shortened cervix
Fetal Evaluation

 Num Of Fetuses:         1
 Fetal Heart Rate(bpm):  130
 Cardiac Activity:       Observed
 Presentation:           Breech
 Placenta:               Posterior

                             Largest Pocket(cm)

Biometry

 BPD:        65  mm     G. Age:  26w 2d         20  %    CI:        71.79   %    70 - 86
                                                         FL/HC:      19.5   %    18.6 -
 HC:      244.2  mm     G. Age:  26w 4d         14  %    HC/AC:      1.12        1.05 -
 AC:      218.3  mm     G. Age:  26w 2d         25  %    FL/BPD:     73.2   %    71 - 87
 FL:       47.6  mm     G. Age:  25w 6d         12  %    FL/AC:      21.8   %    20 - 24
 LV:        7.1  mm

 Est. FW:     902  gm           2 lb     16  %
Gestational Age

 LMP:           26w 6d        Date:  02/09/20                 EDD:   11/15/20
 U/S Today:     26w 2d                                        EDD:   11/19/20
 Best:          26w 6d     Det. By:  LMP  (02/09/20)          EDD:   11/15/20
Anatomy

 Cranium:               Previously seen        Aortic Arch:            Previously seen
 Cavum:                 Appears normal         Ductal Arch:            Previously seen
 Ventricles:            Appears normal         Diaphragm:              Previously seen
 Choroid Plexus:        Previously seen        Stomach:                Appears normal, left
                                                                       sided
 Cerebellum:            Previously seen        Abdomen:                Previously seen
 Posterior Fossa:       Previously seen        Abdominal Wall:         Appears nml (cord
                                                                       insert, abd wall)
 Nuchal Fold:           Previously seen        Cord Vessels:           Previously seen
 Face:                  Appears normal         Kidneys:                Appear normal
                        (orbits and profile)
 Lips:                  Previously seen        Bladder:                Appears normal
 Heart:                 Appears normal         Spine:                  Previously seen
                        (4CH, axis, and
                        situs)
 RVOT:                  Appears normal         Upper Extremities:      Previously seen
 LVOT:                  Appears normal         Lower Extremities:      Previously seen
Cervix Uterus Adnexa

 Cervix
 Length:           5.66  cm.
Impression

 History of preterm delivery.  Patient return for fetal growth
 assessment.  She takes weekly 17 OH progesterone
 injections.
 Amniotic fluid is normal and good fetal activity is seen .Fetal
 growth is appropriate for gestational age .  The estimated
 fetal weight is at the 16th percentile (41st percentile on
 previous ultrasound).

 We reassured the patient of the findings.  Patient will be
 screening for gestational diabetes next week.
Recommendations

 -An appointment was made for her to return in 6 weeks for
 fetal growth assessment.
                 Callen, Arsenio
